# Patient Record
Sex: Female | Born: 2000 | Race: Black or African American | Hispanic: No | Marital: Single | Smoking: Never smoker
Health system: Southern US, Community
[De-identification: ages and names within clinical notes are randomized; demographics above are authoritative.]

## PROBLEM LIST (undated history)

## (undated) DIAGNOSIS — J45909 Unspecified asthma, uncomplicated: Secondary | ICD-10-CM

## (undated) HISTORY — PX: PATENT DUCTUS ARTERIOUS REPAIR: SHX269

---
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
## 2021-01-02 ENCOUNTER — Other Ambulatory Visit: Payer: Self-pay

## 2021-01-02 ENCOUNTER — Emergency Department (HOSPITAL_COMMUNITY): Payer: Self-pay

## 2021-01-02 ENCOUNTER — Emergency Department (HOSPITAL_COMMUNITY)
Admission: EM | Admit: 2021-01-02 | Discharge: 2021-01-02 | Disposition: A | Discharge status: Home / Self Care | Payer: Self-pay | Attending: Emergency Medicine | Admitting: Emergency Medicine

## 2021-01-02 DIAGNOSIS — S6991XA Unspecified injury of right wrist, hand and finger(s), initial encounter: Secondary | ICD-10-CM | POA: Insufficient documentation

## 2021-01-02 DIAGNOSIS — W01198A Fall on same level from slipping, tripping and stumbling with subsequent striking against other object, initial encounter: Secondary | ICD-10-CM | POA: Insufficient documentation

## 2021-01-02 DIAGNOSIS — J45909 Unspecified asthma, uncomplicated: Secondary | ICD-10-CM | POA: Insufficient documentation

## 2021-01-02 MED ORDER — KETOROLAC TROMETHAMINE 60 MG/2ML IM SOLN
60.0000 mg | Freq: Once | INTRAMUSCULAR | Status: AC
  Administered 2021-01-02: 60 mg via INTRAMUSCULAR
  Filled 2021-01-02: qty 2

## 2021-01-02 NOTE — Discharge Instructions (Signed)
Plain films obtained of your right wrist are negative for fracture.  However, your history and physical exam is concerning for a scaphoid bone injury.  Please read the attachment on scaphoid fractures.  I would like for you to follow-up with Dr. Amanda Pea, hand surgery, in the next few days.  Please continue with 600 mg ibuprofen every 6 hours as needed for pain and inflammation.  You may use Tylenol as needed for additional pain relief.  I encourage you to ice your right wrist and keep it elevated whenever possible.  Continue to wear your thumb spica splint.    Return to the ED or seek immediate medical attention should you experience any new worsening symptoms.

## 2021-01-02 NOTE — ED Notes (Signed)
Ortho tech called for placement of thumb spica.
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
## 2021-01-02 NOTE — Progress Notes (Signed)
Orthopedic Tech Progress Note Patient Details:  Sheila Kelly 24-Oct-2001 [id number]  Ortho Devices Type of Ortho Device: Thumb spica splint Ortho Device/Splint Location: right   Post Interventions Patient Tolerated: Well   Saul Fordyce 01/02/2021, 7:23 AM
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
## 2021-04-13 ENCOUNTER — Emergency Department (HOSPITAL_BASED_OUTPATIENT_CLINIC_OR_DEPARTMENT_OTHER)
Admission: EM | Admit: 2021-04-13 | Discharge: 2021-04-13 | Disposition: A | Discharge status: Home / Self Care | Payer: Self-pay | Attending: Emergency Medicine | Admitting: Emergency Medicine

## 2021-04-13 ENCOUNTER — Other Ambulatory Visit: Payer: Self-pay

## 2021-04-13 ENCOUNTER — Encounter (HOSPITAL_BASED_OUTPATIENT_CLINIC_OR_DEPARTMENT_OTHER): Payer: Self-pay | Admitting: Emergency Medicine

## 2021-04-13 DIAGNOSIS — R110 Nausea: Secondary | ICD-10-CM | POA: Insufficient documentation

## 2021-04-13 DIAGNOSIS — R197 Diarrhea, unspecified: Secondary | ICD-10-CM | POA: Insufficient documentation

## 2021-04-13 DIAGNOSIS — R630 Anorexia: Secondary | ICD-10-CM | POA: Insufficient documentation

## 2021-04-13 DIAGNOSIS — J45909 Unspecified asthma, uncomplicated: Secondary | ICD-10-CM | POA: Insufficient documentation

## 2021-04-13 DIAGNOSIS — R1013 Epigastric pain: Secondary | ICD-10-CM | POA: Insufficient documentation

## 2021-04-13 DIAGNOSIS — R109 Unspecified abdominal pain: Secondary | ICD-10-CM

## 2021-04-13 LAB — URINALYSIS, ROUTINE W REFLEX MICROSCOPIC
Bilirubin Urine: NEGATIVE
Glucose, UA: NEGATIVE mg/dL
Hgb urine dipstick: NEGATIVE
Ketones, ur: NEGATIVE mg/dL
Leukocytes,Ua: NEGATIVE
Nitrite: NEGATIVE
Protein, ur: NEGATIVE mg/dL
Specific Gravity, Urine: 1.02 (ref 1.005–1.030)
pH: 6.5 (ref 5.0–8.0)

## 2021-04-13 LAB — CBC WITH DIFFERENTIAL/PLATELET
Abs Immature Granulocytes: 0.01 10*3/uL (ref 0.00–0.07)
Basophils Absolute: 0 10*3/uL (ref 0.0–0.1)
Basophils Relative: 0 %
Eosinophils Absolute: 0.1 10*3/uL (ref 0.0–0.5)
Eosinophils Relative: 1 %
HCT: 39.5 % (ref 36.0–46.0)
Hemoglobin: 12.5 g/dL (ref 12.0–15.0)
Immature Granulocytes: 0 %
Lymphocytes Relative: 26 %
Lymphs Abs: 1.6 10*3/uL (ref 0.7–4.0)
MCH: 24.2 pg — ABNORMAL LOW (ref 26.0–34.0)
MCHC: 31.6 g/dL (ref 30.0–36.0)
MCV: 76.4 fL — ABNORMAL LOW (ref 80.0–100.0)
Monocytes Absolute: 0.4 10*3/uL (ref 0.1–1.0)
Monocytes Relative: 6 %
Neutro Abs: 4.2 10*3/uL (ref 1.7–7.7)
Neutrophils Relative %: 67 %
Platelets: 235 10*3/uL (ref 150–400)
RBC: 5.17 MIL/uL — ABNORMAL HIGH (ref 3.87–5.11)
RDW: 14.6 % (ref 11.5–15.5)
WBC: 6.2 10*3/uL (ref 4.0–10.5)
nRBC: 0 % (ref 0.0–0.2)

## 2021-04-13 LAB — COMPREHENSIVE METABOLIC PANEL
ALT: 11 U/L (ref 0–44)
AST: 20 U/L (ref 15–41)
Albumin: 4.4 g/dL (ref 3.5–5.0)
Alkaline Phosphatase: 43 U/L (ref 38–126)
Anion gap: 6 (ref 5–15)
BUN: 9 mg/dL (ref 6–20)
CO2: 25 mmol/L (ref 22–32)
Calcium: 9 mg/dL (ref 8.9–10.3)
Chloride: 106 mmol/L (ref 98–111)
Creatinine, Ser: 0.97 mg/dL (ref 0.44–1.00)
GFR, Estimated: >60 mL/min (ref >60)
Glucose, Bld: 83 mg/dL (ref 70–99)
Potassium: 3.9 mmol/L (ref 3.5–5.1)
Sodium: 137 mmol/L (ref 135–145)
Total Bilirubin: 0.4 mg/dL (ref 0.3–1.2)
Total Protein: 7.1 g/dL (ref 6.5–8.1)

## 2021-04-13 LAB — LIPASE, BLOOD: Lipase: 37 U/L (ref 11–51)

## 2021-04-13 LAB — WET PREP, GENITAL
Clue Cells Wet Prep HPF POC: NONE SEEN
Sperm: NONE SEEN
Trich, Wet Prep: NONE SEEN
Yeast Wet Prep HPF POC: NONE SEEN

## 2021-04-13 LAB — PREGNANCY, URINE: Preg Test, Ur: NEGATIVE

## 2021-04-13 MED ORDER — PANTOPRAZOLE SODIUM 40 MG IV SOLR
40.0000 mg | Freq: Once | INTRAVENOUS | Status: AC
  Administered 2021-04-13: 40 mg via INTRAVENOUS
  Filled 2021-04-13: qty 40

## 2021-04-13 MED ORDER — ONDANSETRON HCL 4 MG PO TABS: 4.0000 mg | ORAL_TABLET | Freq: Three times a day (TID) | ORAL | 0 refills | Status: DC | PRN

## 2021-04-13 MED ORDER — SODIUM CHLORIDE 0.9 % IV SOLN
INTRAVENOUS | Status: DC | PRN
  Administered 2021-04-13: 500 mL via INTRAVENOUS

## 2021-04-13 MED ORDER — FAMOTIDINE IN NACL 20-0.9 MG/50ML-% IV SOLN
20.0000 mg | Freq: Once | INTRAVENOUS | Status: AC
  Administered 2021-04-13: 20 mg via INTRAVENOUS
  Filled 2021-04-13: qty 50

## 2021-04-13 MED ORDER — ONDANSETRON HCL 4 MG/2ML IJ SOLN
4.0000 mg | Freq: Once | INTRAMUSCULAR | Status: AC
  Administered 2021-04-13: 4 mg via INTRAVENOUS
  Filled 2021-04-13: qty 2

## 2021-04-13 NOTE — ED Notes (Signed)
Pelvic cart at room 

## 2021-04-13 NOTE — ED Notes (Signed)
Ptt given fluids and/or food for PO challenge. Pt verbalized understanding to utilize call bell if nausea or emesis occur.

## 2021-04-13 NOTE — ED Provider Notes (Signed)
[facility] EMERGENCY DEPARTMENT Provider Note   CSN: [id number] Arrival date & time: 04/13/21  0748     History Chief Complaint  Patient presents with   Abdominal Pain    Sheila Kelly is a 20 y.o. female.  HPI  20 year old female presents the emergency department epigastric discomfort and diarrhea that has been going on intermittently for the past month.  She has been having severe nausea and decreased appetite.  Sometimes eating makes this discomfort better.  Patient also has concern for intermittent vaginal bleeding for the past 5 months and a foul odor.  Patient states she previously had the Implanon which was causing breakthrough bleeding.  She changed to the Depo shot which had her well controlled but she recently stopped taking the Depo shot.  [facility] does not have a consistent OB/GYN.  She denies any abnormal vaginal discharge but endorses a "fishy" like odor.  She is sexually active.  No fever or pelvic pain.  Past Medical History:  Diagnosis Date   Asthma    Prematurity     There are no problems to display for this patient.   Past Surgical History:  Procedure Laterality Date   PATENT DUCTUS ARTERIOUS REPAIR       OB History   No obstetric history on file.     History reviewed. No pertinent family history.  Social History   Tobacco Use   Smoking status: Never   Smokeless tobacco: Never  Vaping Use   Vaping Use: Every day  Substance Use Topics   Alcohol use: Yes    Comment: once a week   Drug use: Yes    Types: Marijuana    Comment: daily    Home Medications Prior to Admission medications   Medication Sig Start Date End Date Taking? Authorizing Provider  ondansetron (ZOFRAN ODT) 4 MG disintegrating tablet Take 1 tablet (4 mg total) by mouth every 8 (eight) hours as needed for nausea or vomiting. 06/20/18   Antony Madura, PA-C    Allergies    Patient has no known allergies.  Review of Systems   Review of  Systems  Constitutional:  Negative for chills and fever.  Respiratory:  Negative for shortness of breath.   Cardiovascular:  Negative for chest pain.  Gastrointestinal:  Negative for abdominal pain, diarrhea and vomiting.  Genitourinary:  Positive for dyspareunia, menstrual problem and vaginal bleeding. Negative for dysuria, pelvic pain, vaginal discharge and vaginal pain.  Skin:  Negative for rash.  Neurological:  Negative for headaches.   Physical Exam Updated Vital Signs BP 121/74   Pulse (!) 53   Temp 98.7 F (37.1 C) (Oral)   Resp 18   Ht 5\' 8"  (1.727 m)   Wt 54.4 kg   LMP 02/17/2021 Comment: Vaginal bleeding has continued since 4/17  SpO2 96%   BMI 18.25 kg/m   Physical Exam Vitals and nursing note reviewed.  Constitutional:      Appearance: Normal appearance.  HENT:     Head: Normocephalic.     Mouth/Throat:     Mouth: Mucous membranes are moist.  Cardiovascular:     Rate and Rhythm: Normal rate.  Pulmonary:     Effort: Pulmonary effort is normal. No respiratory distress.  Abdominal:     Palpations: Abdomen is soft.     Tenderness: There is no abdominal tenderness.  Genitourinary:    Vagina: No signs of injury. No tenderness or bleeding.     Comments: Chaperoned by  RN, small amount of what appears to be physiologic discharge, slight odor, no bleeding or structural abnormality Skin:    General: Skin is warm.  Neurological:     Mental Status: She is alert and oriented to person, place, and time. Mental status is at baseline.  Psychiatric:        Mood and Affect: Mood normal.    ED Results / Procedures / Treatments   Labs (all labs ordered are listed, but only abnormal results are displayed) Labs Reviewed  CBC WITH DIFFERENTIAL/PLATELET - Abnormal; Notable for the following components:      Result Value   RBC 5.17 (*)    MCV 76.4 (*)    MCH 24.2 (*)    All other components within normal limits  WET PREP, GENITAL  COMPREHENSIVE METABOLIC PANEL  LIPASE,  BLOOD  URINALYSIS, ROUTINE W REFLEX MICROSCOPIC  PREGNANCY, URINE  GC/CHLAMYDIA PROBE AMP ([place]) NOT AT [facility]    EKG None  Radiology No results found.  Procedures Procedures   Medications Ordered in ED Medications  0.9 %  sodium chloride infusion ( Intravenous Infusion Verify 04/13/21 1020)  pantoprazole (PROTONIX) injection 40 mg (has no administration in time range)  famotidine (PEPCID) IVPB 20 mg premix (0 mg Intravenous Stopped 04/13/21 1016)  ondansetron (ZOFRAN) injection 4 mg (4 mg Intravenous Given 04/13/21 7616)    ED Course  I have reviewed the triage vital signs and the nursing notes.  Pertinent labs & imaging results that were available during my care of the patient were reviewed by me and considered in my medical decision making (see chart for details).    MDM Rules/Calculators/A&P                          20 year old female presents the emergency department epigastric discomfort and diarrhea has been intermittent for the past month.  She also states that she has had intermittent breakthrough bleeding for the past 5 months after stopping her Depo shot.  Does not currently have an OB/GYN.  Denies any other abnormal vaginal discharge but endorses a fishy odor from the vagina.  No fever.  Abdomen is benign.  Pregnancy test is negative, hemoglobin is stable, abdominal labs are normal, blood work is reassuring, after medications she feels significant better is tolerating p.o.  Pelvic which was chaperoned by RN showed no abnormal discharge or odor, no bleeding.  Wet prep shows white blood cells without any other findings.  GC chlamydia swab sent.  Instructed patient to follow-up with primary doctor and establish OB/GYN care.  Patient will be discharged and treated as an outpatient.  Discharge plan and strict return to ED precautions discussed, patient verbalizes understanding and agreement.   Final Clinical Impression(s) / ED Diagnoses Final diagnoses:  None     Rx / DC Orders ED Discharge Orders     None        Rozelle Logan, DO 04/13/21 1227

## 2021-04-13 NOTE — ED Notes (Signed)
ED Provider at bedside. 

## 2021-04-13 NOTE — Discharge Instructions (Addendum)
You have been seen and discharged from the emergency department.  Take nausea medicine as needed, stay well-hydrated.  Your cervical swab is still pending, you will be contacted if there is an abnormality found.  You need to establish care with an OB/GYN to be reevaluated for intermittent vaginal bleeding and birth control options.  Follow-up with your primary provider for reevaluation and further care. Take home medications as prescribed. If you have any worsening symptoms or further concerns for your health please return to an emergency department for further evaluation.

## 2021-04-13 NOTE — ED Triage Notes (Signed)
Pt arrives pov with c/o upper abdominal pain and diarrhea x 1 month. Pt endorses nausea, denies emesis. Pt reports eating makes pain better. Pt also reports vaginal bleeding x 5 months

## 2021-04-13 NOTE — ED Notes (Signed)
Pt ambulatory with steady gait to restroom 

## 2021-04-13 NOTE — ED Notes (Signed)
Pt ambulatory with steady gait to restroom to provide urine specimen 

## 2021-04-13 NOTE — ED Notes (Addendum)
Chaperone for EDP Horton, MD for pelvic exam. Specimens collected, Pt tolerated well.

## 2021-04-15 LAB — GC/CHLAMYDIA PROBE AMP (~~LOC~~) NOT AT ARMC
Chlamydia: NEGATIVE
Comment: NEGATIVE
Comment: NORMAL
Neisseria Gonorrhea: NEGATIVE

## 2022-02-04 ENCOUNTER — Encounter (HOSPITAL_COMMUNITY): Payer: Self-pay | Admitting: *Deleted

## 2022-02-04 ENCOUNTER — Inpatient Hospital Stay (HOSPITAL_COMMUNITY)
Admission: AD | Admit: 2022-02-04 | Discharge: 2022-02-04 | Discharge status: Against Medical Advice | Payer: BC Managed Care – PPO | Attending: Family Medicine | Admitting: Family Medicine

## 2022-02-04 DIAGNOSIS — N939 Abnormal uterine and vaginal bleeding, unspecified: Secondary | ICD-10-CM | POA: Diagnosis not present

## 2022-02-04 DIAGNOSIS — Z3201 Encounter for pregnancy test, result positive: Secondary | ICD-10-CM | POA: Insufficient documentation

## 2022-02-04 DIAGNOSIS — R109 Unspecified abdominal pain: Secondary | ICD-10-CM | POA: Insufficient documentation

## 2022-02-04 DIAGNOSIS — O00102 Left tubal pregnancy without intrauterine pregnancy: Secondary | ICD-10-CM | POA: Diagnosis not present

## 2022-02-04 LAB — COMPREHENSIVE METABOLIC PANEL
ALT: 15 U/L (ref 0–44)
AST: 20 U/L (ref 15–41)
Albumin: 4.2 g/dL (ref 3.5–5.0)
Alkaline Phosphatase: 59 U/L (ref 38–126)
Anion gap: 8 (ref 5–15)
BUN: 10 mg/dL (ref 6–20)
CO2: 23 mmol/L (ref 22–32)
Calcium: 9.1 mg/dL (ref 8.9–10.3)
Chloride: 106 mmol/L (ref 98–111)
Creatinine, Ser: 0.89 mg/dL (ref 0.44–1.00)
GFR, Estimated: >60 mL/min (ref >60)
Glucose, Bld: 82 mg/dL (ref 70–99)
Potassium: 3.6 mmol/L (ref 3.5–5.1)
Sodium: 137 mmol/L (ref 135–145)
Total Bilirubin: 0.6 mg/dL (ref 0.3–1.2)
Total Protein: 7.1 g/dL (ref 6.5–8.1)

## 2022-02-04 LAB — WET PREP, GENITAL
Sperm: NONE SEEN
Trich, Wet Prep: NONE SEEN
WBC, Wet Prep HPF POC: <10 (ref <10)
Yeast Wet Prep HPF POC: NONE SEEN

## 2022-02-04 LAB — CBC
HCT: 35.8 % — ABNORMAL LOW (ref 36.0–46.0)
Hemoglobin: 11.3 g/dL — ABNORMAL LOW (ref 12.0–15.0)
MCH: 23.7 pg — ABNORMAL LOW (ref 26.0–34.0)
MCHC: 31.6 g/dL (ref 30.0–36.0)
MCV: 75.2 fL — ABNORMAL LOW (ref 80.0–100.0)
Platelets: 252 10*3/uL (ref 150–400)
RBC: 4.76 MIL/uL (ref 3.87–5.11)
RDW: 14.1 % (ref 11.5–15.5)
WBC: 6 10*3/uL (ref 4.0–10.5)
nRBC: 0 % (ref 0.0–0.2)

## 2022-02-04 LAB — URINALYSIS, ROUTINE W REFLEX MICROSCOPIC
Bilirubin Urine: NEGATIVE
Glucose, UA: NEGATIVE mg/dL
Hgb urine dipstick: NEGATIVE
Ketones, ur: 5 mg/dL — AB
Leukocytes,Ua: NEGATIVE
Nitrite: NEGATIVE
Protein, ur: NEGATIVE mg/dL
Specific Gravity, Urine: 1.021 (ref 1.005–1.030)
pH: 5 (ref 5.0–8.0)

## 2022-02-04 LAB — POCT PREGNANCY, URINE: Preg Test, Ur: POSITIVE — AB

## 2022-02-04 LAB — HCG, QUANTITATIVE, PREGNANCY: hCG, Beta Chain, Quant, S: 7899 m[IU]/mL — ABNORMAL HIGH (ref <5)

## 2022-02-04 LAB — ABO/RH: ABO/RH(D): B POS

## 2022-02-04 NOTE — MAU Provider Note (Signed)
None  ?  ? ?S ?Ms. Zulma Court is a 21 y.o. G1P0 patient who presents to MAU today with complaint of spotting and cramping.   ? ?O ?LMP 11/09/2021  ?Physical Exam ?Constitutional:   ?   General: She is not in acute distress. ?   Appearance: Normal appearance.  ?HENT:  ?   Head: Normocephalic and atraumatic.  ?Eyes:  ?   Conjunctiva/sclera: Conjunctivae normal.  ?Pulmonary:  ?   Effort: Pulmonary effort is normal. No respiratory distress.  ?Musculoskeletal:     ?   General: Normal range of motion.  ?   Cervical back: Normal range of motion.  ?Neurological:  ?   Mental Status: She is oriented to person, place, and time.  ?Psychiatric:     ?   Mood and Affect: Mood normal.     ?   Behavior: Behavior normal.  ? ? ?A ?Medical screening exam complete ?+UPT ? ?P ?Patient [facility] verification from [facility]. ?Initial labs and cultures entered and collected.  ?Patient informed that provider will return for formal HPI and assessment. ? ?Cherre Robins ?02/04/2022, 11:21 AM  ? ?Reassessment (12:36 PM) ? ?Triage nurse to collect patient and reports patient not in lobby. ?Provider will call patient once results return ? ?Cherre Robins MSN, CNM ?Advanced Practice Provider, Center for [employer] ? ?

## 2022-02-04 NOTE — Progress Notes (Signed)
Pt called into triage, pt left AMA during mid exam.  Work up initiated while in lobby waiting. ? ?

## 2022-02-05 ENCOUNTER — Inpatient Hospital Stay (HOSPITAL_COMMUNITY): Payer: BC Managed Care – PPO

## 2022-02-05 ENCOUNTER — Encounter (HOSPITAL_COMMUNITY): Payer: Self-pay | Admitting: Family Medicine

## 2022-02-05 ENCOUNTER — Other Ambulatory Visit: Payer: Self-pay

## 2022-02-05 ENCOUNTER — Inpatient Hospital Stay (HOSPITAL_COMMUNITY)
Admission: AD | Admit: 2022-02-05 | Discharge: 2022-02-05 | Disposition: A | Discharge status: Home / Self Care | Payer: BC Managed Care – PPO | Attending: Family Medicine | Admitting: Family Medicine

## 2022-02-05 ENCOUNTER — Inpatient Hospital Stay (HOSPITAL_COMMUNITY): Payer: BC Managed Care – PPO | Admitting: Certified Registered Nurse Anesthetist

## 2022-02-05 ENCOUNTER — Encounter (HOSPITAL_COMMUNITY)
Admission: AD | Disposition: A | Discharge status: Home / Self Care | Payer: Self-pay | Source: Home / Self Care | Attending: Family Medicine

## 2022-02-05 DIAGNOSIS — O00109 Unspecified tubal pregnancy without intrauterine pregnancy: Secondary | ICD-10-CM | POA: Diagnosis present

## 2022-02-05 DIAGNOSIS — N736 Female pelvic peritoneal adhesions (postinfective): Secondary | ICD-10-CM | POA: Diagnosis not present

## 2022-02-05 DIAGNOSIS — O00102 Left tubal pregnancy without intrauterine pregnancy: Secondary | ICD-10-CM

## 2022-02-05 DIAGNOSIS — Z3A01 Less than 8 weeks gestation of pregnancy: Secondary | ICD-10-CM | POA: Insufficient documentation

## 2022-02-05 DIAGNOSIS — N83201 Unspecified ovarian cyst, right side: Secondary | ICD-10-CM | POA: Diagnosis not present

## 2022-02-05 DIAGNOSIS — Z6790 Unspecified blood type, Rh positive: Secondary | ICD-10-CM

## 2022-02-05 HISTORY — PX: DIAGNOSTIC LAPAROSCOPY WITH REMOVAL OF ECTOPIC PREGNANCY: SHX6449

## 2022-02-05 LAB — GC/CHLAMYDIA PROBE AMP (~~LOC~~) NOT AT ARMC
Chlamydia: NEGATIVE
Comment: NEGATIVE
Comment: NORMAL
Neisseria Gonorrhea: NEGATIVE

## 2022-02-05 LAB — TYPE AND SCREEN
ABO/RH(D): B POS
Antibody Screen: NEGATIVE

## 2022-02-05 SURGERY — LAPAROSCOPY, WITH ECTOPIC PREGNANCY SURGICAL TREATMENT
Anesthesia: General

## 2022-02-05 MED ORDER — VASOPRESSIN 20 UNIT/ML IV SOLN
INTRAVENOUS | Status: AC
  Filled 2022-02-05: qty 1

## 2022-02-05 MED ORDER — PROPOFOL 10 MG/ML IV BOLUS
INTRAVENOUS | Status: AC
  Filled 2022-02-05: qty 20

## 2022-02-05 MED ORDER — CELECOXIB 200 MG PO CAPS: 200.0000 mg | ORAL_CAPSULE | Freq: Once | ORAL | Status: DC

## 2022-02-05 MED ORDER — SUCCINYLCHOLINE CHLORIDE 200 MG/10ML IV SOSY
PREFILLED_SYRINGE | INTRAVENOUS | Status: AC
  Filled 2022-02-05: qty 10

## 2022-02-05 MED ORDER — CHLORHEXIDINE GLUCONATE 0.12 % MT SOLN
15.0000 mL | Freq: Once | OROMUCOSAL | Status: DC
  Filled 2022-02-05: qty 15

## 2022-02-05 MED ORDER — FENTANYL CITRATE (PF) 250 MCG/5ML IJ SOLN
INTRAMUSCULAR | Status: AC
  Filled 2022-02-05: qty 5

## 2022-02-05 MED ORDER — OXYCODONE HCL 5 MG/5ML PO SOLN: 5.0000 mg | Freq: Once | ORAL | Status: DC | PRN

## 2022-02-05 MED ORDER — SODIUM CHLORIDE 0.9 % IR SOLN
Status: DC | PRN
  Administered 2022-02-05: 3000 mL

## 2022-02-05 MED ORDER — MIDAZOLAM HCL 2 MG/2ML IJ SOLN
INTRAMUSCULAR | Status: AC
  Filled 2022-02-05: qty 2

## 2022-02-05 MED ORDER — ACETAMINOPHEN 325 MG PO TABS: 650.0000 mg | ORAL_TABLET | Freq: Four times a day (QID) | ORAL | Status: AC | PRN

## 2022-02-05 MED ORDER — IBUPROFEN 600 MG PO TABS: 600.0000 mg | ORAL_TABLET | Freq: Four times a day (QID) | ORAL | 0 refills | Status: AC | PRN

## 2022-02-05 MED ORDER — FENTANYL CITRATE (PF) 100 MCG/2ML IJ SOLN
INTRAMUSCULAR | Status: AC
  Filled 2022-02-05: qty 2

## 2022-02-05 MED ORDER — ONDANSETRON 4 MG PO TBDP
4.0000 mg | ORAL_TABLET | Freq: Three times a day (TID) | ORAL | 0 refills | Status: DC | PRN
Start: 2022-02-05 — End: 2023-03-13

## 2022-02-05 MED ORDER — SUGAMMADEX SODIUM 200 MG/2ML IV SOLN
INTRAVENOUS | Status: DC | PRN
  Administered 2022-02-05: 150 mg via INTRAVENOUS

## 2022-02-05 MED ORDER — LIDOCAINE 2% (20 MG/ML) 5 ML SYRINGE
INTRAMUSCULAR | Status: DC | PRN
  Administered 2022-02-05: 50 mg via INTRAVENOUS

## 2022-02-05 MED ORDER — BUPIVACAINE HCL (PF) 0.25 % IJ SOLN
INTRAMUSCULAR | Status: AC
  Filled 2022-02-05: qty 30

## 2022-02-05 MED ORDER — LIDOCAINE 2% (20 MG/ML) 5 ML SYRINGE
INTRAMUSCULAR | Status: AC
  Filled 2022-02-05: qty 5

## 2022-02-05 MED ORDER — OXYCODONE HCL 5 MG PO TABS: 5.0000 mg | ORAL_TABLET | Freq: Four times a day (QID) | ORAL | 0 refills | Status: AC | PRN

## 2022-02-05 MED ORDER — ONDANSETRON HCL 4 MG/2ML IJ SOLN: 4.0000 mg | Freq: Once | INTRAMUSCULAR | Status: DC | PRN

## 2022-02-05 MED ORDER — SUCCINYLCHOLINE CHLORIDE 200 MG/10ML IV SOSY
PREFILLED_SYRINGE | INTRAVENOUS | Status: DC | PRN
  Administered 2022-02-05: 120 mg via INTRAVENOUS

## 2022-02-05 MED ORDER — ORAL CARE MOUTH RINSE: 15.0000 mL | Freq: Once | OROMUCOSAL | Status: DC

## 2022-02-05 MED ORDER — ROCURONIUM BROMIDE 10 MG/ML (PF) SYRINGE
PREFILLED_SYRINGE | INTRAVENOUS | Status: AC
  Filled 2022-02-05: qty 10

## 2022-02-05 MED ORDER — OXYCODONE HCL 5 MG PO TABS: 5.0000 mg | ORAL_TABLET | Freq: Once | ORAL | Status: DC | PRN

## 2022-02-05 MED ORDER — FENTANYL CITRATE (PF) 100 MCG/2ML IJ SOLN
25.0000 ug | INTRAMUSCULAR | Status: DC | PRN
  Administered 2022-02-05: 50 ug via INTRAVENOUS
  Administered 2022-02-05: 25 ug via INTRAVENOUS

## 2022-02-05 MED ORDER — DEXAMETHASONE SODIUM PHOSPHATE 10 MG/ML IJ SOLN
INTRAMUSCULAR | Status: DC | PRN
  Administered 2022-02-05: 10 mg via INTRAVENOUS

## 2022-02-05 MED ORDER — OXYCODONE HCL 5 MG PO CAPS: 5.0000 mg | ORAL_CAPSULE | Freq: Four times a day (QID) | ORAL | 0 refills | Status: DC | PRN

## 2022-02-05 MED ORDER — MIDAZOLAM HCL 2 MG/2ML IJ SOLN
INTRAMUSCULAR | Status: DC | PRN
Start: 2022-02-05 — End: 2022-02-05
  Administered 2022-02-05: 2 mg via INTRAVENOUS

## 2022-02-05 MED ORDER — LACTATED RINGERS IV SOLN: INTRAVENOUS | Status: DC

## 2022-02-05 MED ORDER — ROCURONIUM BROMIDE 10 MG/ML (PF) SYRINGE
PREFILLED_SYRINGE | INTRAVENOUS | Status: DC | PRN
  Administered 2022-02-05: 40 mg via INTRAVENOUS

## 2022-02-05 MED ORDER — ONDANSETRON HCL 4 MG/2ML IJ SOLN
INTRAMUSCULAR | Status: AC
  Filled 2022-02-05: qty 2

## 2022-02-05 MED ORDER — FENTANYL CITRATE (PF) 250 MCG/5ML IJ SOLN
INTRAMUSCULAR | Status: DC | PRN
  Administered 2022-02-05: 50 ug via INTRAVENOUS
  Administered 2022-02-05: 100 ug via INTRAVENOUS
  Administered 2022-02-05: 25 ug via INTRAVENOUS

## 2022-02-05 MED ORDER — PROPOFOL 10 MG/ML IV BOLUS
INTRAVENOUS | Status: DC | PRN
  Administered 2022-02-05: 200 mg via INTRAVENOUS

## 2022-02-05 MED ORDER — LACTATED RINGERS IV BOLUS
1000.0000 mL | Freq: Once | INTRAVENOUS | Status: AC
  Administered 2022-02-05: 1000 mL via INTRAVENOUS

## 2022-02-05 MED ORDER — DOCUSATE SODIUM 100 MG PO CAPS: 100.0000 mg | ORAL_CAPSULE | Freq: Two times a day (BID) | ORAL | 0 refills | Status: AC | PRN

## 2022-02-05 MED ORDER — ONDANSETRON HCL 4 MG/2ML IJ SOLN
INTRAMUSCULAR | Status: DC | PRN
  Administered 2022-02-05: 4 mg via INTRAVENOUS

## 2022-02-05 MED ORDER — BUPIVACAINE HCL (PF) 0.25 % IJ SOLN
INTRAMUSCULAR | Status: DC | PRN
  Administered 2022-02-05: 6 mL

## 2022-02-05 MED ORDER — ACETAMINOPHEN 500 MG PO TABS: 1000.0000 mg | ORAL_TABLET | Freq: Once | ORAL | Status: DC

## 2022-02-05 MED ORDER — DEXAMETHASONE SODIUM PHOSPHATE 10 MG/ML IJ SOLN
INTRAMUSCULAR | Status: AC
  Filled 2022-02-05: qty 1

## 2022-02-05 SURGICAL SUPPLY — 30 items
DERMABOND ADVANCED (GAUZE/BANDAGES/DRESSINGS) ×1
DERMABOND ADVANCED .7 DNX12 (GAUZE/BANDAGES/DRESSINGS) ×1 IMPLANT
DRSG OPSITE POSTOP 3X4 (GAUZE/BANDAGES/DRESSINGS) ×1 IMPLANT
DURAPREP 26ML APPLICATOR (WOUND CARE) ×2 IMPLANT
GLOVE SURG ENC MOIS LTX SZ7 (GLOVE) ×2 IMPLANT
GLOVE SURG UNDER POLY LF SZ7 (GLOVE) ×8 IMPLANT
GOWN STRL REUS W/ TWL LRG LVL3 (GOWN DISPOSABLE) ×3 IMPLANT
GOWN STRL REUS W/TWL LRG LVL3 (GOWN DISPOSABLE) ×3
IRRIG SUCT STRYKERFLOW 2 WTIP (MISCELLANEOUS) ×2
IRRIGATION SUCT STRKRFLW 2 WTP (MISCELLANEOUS) ×1 IMPLANT
KIT TURNOVER KIT B (KITS) ×2 IMPLANT
PACK LAPAROSCOPY BASIN (CUSTOM PROCEDURE TRAY) ×2 IMPLANT
PACK TRENDGUARD 450 HYBRID PRO (MISCELLANEOUS) IMPLANT
POUCH SPECIMEN RETRIEVAL 10MM (ENDOMECHANICALS) ×1 IMPLANT
PROTECTOR NERVE ULNAR (MISCELLANEOUS) ×4 IMPLANT
SET TUBE SMOKE EVAC HIGH FLOW (TUBING) ×2 IMPLANT
SHEARS HARMONIC ACE PLUS 36CM (ENDOMECHANICALS) ×1 IMPLANT
SLEEVE ENDOPATH XCEL 5M (ENDOMECHANICALS) ×2 IMPLANT
SPONGE T-LAP 18X18 ~~LOC~~+RFID (SPONGE) ×2 IMPLANT
SUT VIC AB 3-0 PS2 18 (SUTURE) ×1
SUT VIC AB 3-0 PS2 18XBRD (SUTURE) ×1 IMPLANT
SUT VICRYL 0 UR6 27IN ABS (SUTURE) ×4 IMPLANT
SUT VICRYL 4-0 PS2 18IN ABS (SUTURE) ×2 IMPLANT
TOWEL GREEN STERILE FF (TOWEL DISPOSABLE) ×4 IMPLANT
TRAY FOLEY W/BAG SLVR 14FR (SET/KITS/TRAYS/PACK) ×2 IMPLANT
TRENDGUARD 450 HYBRID PRO PACK (MISCELLANEOUS) ×2
TROCAR BALLN 12MMX100 BLUNT (TROCAR) ×2 IMPLANT
TROCAR XCEL NON-BLD 11X100MML (ENDOMECHANICALS) ×1 IMPLANT
TROCAR XCEL NON-BLD 5MMX100MML (ENDOMECHANICALS) ×2 IMPLANT
WARMER LAPAROSCOPE (MISCELLANEOUS) ×2 IMPLANT

## 2022-02-05 NOTE — H&P (Signed)
?  Sheila Kelly is an 21 y.o. G1P0 female.   ?Chief Complaint: spotting ?HPI: patient with bleeding and LLQ cramping, comes to MAU for eval of pregnancy. U/S reveals living left sided ectopic. ? ?Past Medical History:  ?Diagnosis Date  ? Asthma   ? Prematurity   ? ? ?Past Surgical History:  ?Procedure Laterality Date  ? PATENT DUCTUS ARTERIOUS REPAIR    ? ? ?No family history on file. ?Social History:  reports that she has never smoked. She has never used smokeless tobacco. She reports current alcohol use. She reports current drug use. Drug: Marijuana. ? ?Allergies: No Known Allergies ? ?Medications Prior to Admission  ?Medication Sig Dispense Refill  ? ondansetron (ZOFRAN) 4 MG tablet Take 1 tablet (4 mg total) by mouth every 8 (eight) hours as needed for nausea or vomiting. 4 tablet 0  ? ? ?A comprehensive review of systems was negative. ? ?Blood pressure 116/71, pulse 63, temperature 99.1 ?F (37.3 ?C), temperature source Oral, resp. rate 16, height 5\' 8"  (1.727 m), weight 54.2 kg, last menstrual period 11/09/2021, SpO2 100 %. ?General appearance: alert, cooperative, and appears stated age ?Head: Normocephalic, without obvious abnormality, atraumatic ?Neck: no adenopathy, supple, symmetrical, trachea midline, and thyroid not enlarged, symmetric, no tenderness/mass/nodules ?Lungs: normal effort ?Heart: Regular rate and rhythm ?Abdomen: soft, non-tender; bowel sounds normal; no masses,  no organomegaly ?Extremities: extremities normal, atraumatic, no cyanosis or edema ?Skin: Skin color, texture, turgor normal. No rashes or lesions ?Neurologic: Grossly normal ? ? ?Lab Results  ?Component Value Date  ? WBC 6.0 02/04/2022  ? HGB 11.3 (L) 02/04/2022  ? HCT 35.8 (L) 02/04/2022  ? MCV 75.2 (L) 02/04/2022  ? PLT 252 02/04/2022  ? ?Lab Results  ?Component Value Date  ? PREGTESTUR POSITIVE (A) 02/04/2022  ? HCG <5.0 06/19/2018  ? ? ? ?Assessment/Plan ?Left tubal pregnancy without intrauterine pregnancy ? ?[redacted] weeks gestation of  pregnancy ? ?Blood type, Rh positive ? ?For Laparoscopic removal. ? ?Risks include but are not limited to bleeding, infection, injury to surrounding structures, including bowel, bladder and ureters, blood clots, and death.  Likelihood of success is high. ? ? ? ?06/21/2018 ?02/05/2022, 11:42 AM ? ? ? ?

## 2022-02-05 NOTE — MAU Note (Signed)
Sheila Kelly is a 21 y.o. at [redacted]w[redacted]d here in MAU reporting: ongoing spotting and intermittent cramping. Is wearing a panty liner and changes it 2 times per day. ? ?Onset of complaint: ongoing ? ?Pain score: 0/10 ? ?Vitals:  ? 02/05/22 1032  ?BP: 116/71  ?Pulse: 63  ?Resp: 16  ?Temp: 99.1 ?F (37.3 ?C)  ?SpO2: 100%  ?   ?Lab orders placed from triage: none ? ?

## 2022-02-05 NOTE — MAU Note (Signed)
Report given to Leanne RN

## 2022-02-05 NOTE — Op Note (Signed)
Operative Note  ? ?02/05/2022 ? ?PRE-OP DIAGNOSIS ?*Left tubal ectopic pregnancy ?  ?POST-OP DIAGNOSIS ?*Same ?*Hemoperitoneum ?*Pelvic adhesive disease ?*Simple right ovarian cyst  ? ?SURGEON: Cornelia Copa. MD ? ?ASSISTANT: none ? ?PROCEDURE: Laparoscopic left salpingectomy, lysis of adhesions less than 45 minutes ? ?ANESTHESIA: General and local ? ?ESTIMATED BLOOD LOSS:  minimal for case . of hemoperitoneum ? ?DRAINS: indwelling foley 69mL UOP  ? ?TOTAL IV FLUIDS: per OR report ? ?VTE PROPHYLAXIS: SCDs to the bilateral lower extremities ? ?ANTIBIOTICS: Note indicated ? ?SPECIMENS: left fallopian tube ? ?DISPOSITION: PACU - hemodynamically stable. ? ?CONDITION: stable ? ?COMPLICATIONS: None ? ?FINDINGS: Normal EGBUS, vagina and cervix. On laparoscopy, normal liver and stomach edge and no intra-abdominal adhesions. In the pelvis, grossly normal uterus with bilateral adnexa adhesions. On the left side, the tube was engorged at the ampulla region and with evidence of bleeding from the tube of the tube. The left tube and ovary were encased together with adhesions. The ureter on this side was identified and well away from the operative field. The right tube and ovary were also encased together with the right tube grossly normal and the right ovary with a 2cm simple appearing cyst. Approximately of hemoperitoneum was noted in the cul de sac.  ? ?At the end of the procedure the left ovary, was still stuck to the left pelvic sidewall.  ? ?DESCRIPTION OF PROCEDURE: After informed consent was obtained, the patient was taken to the operating room where anesthesia was obtained without difficulty. The patient was positioned in the dorsal lithotomy position in [place] stirrups and her arms were carefully tucked at her sides and the usual precautions were taken.  She was prepped and draped in normal sterile fashion.  Time-out was performed and a Foley catheter was placed into the bladder. A standard H[place]ka uterine  manipulator was then placed in the uterus without incident. Gloves were then changed, and after injection of local anesthesia, the open technique was used to place an infraumbilical 12-mm balloon trocar under direct visualization. The laparoscope was introduced and CO2 gas was infused for pneumoperitoneum to a pressure of 15 mm Hg and the area below inspected for injury.  The patient was placed in Trendelenburg and the bowel was displaced up into the upper abdomen, and the left lateral and suprapubic 5-mm ports were placed under direct visualization of the laparoscope, after injection of local anesthesia.   ? The left ureter was then identified and the tube and ovary were dissected from each. Starting from the cornua, the Harmonic Scalpel was used to serially cauterize and cut the mesosalpinx and more dissection and lysis of adhesions done to help separate the fallopian tube from the ovary until the ovary and tube were freed and tube free. It was then removed with the laparoscopic bag. The operative site was hemostatic but cauterized with the kleppinger for extra hemostasis at the cornua and the meso-salpinx; the left ureter was again identified and traced into the lower pelvis and stil noted to be well away from the operative field. The pressure was lowered to with hemostasis still noted. The 50mm ports were then removed under direct visualization and the the balloon trocar removed. The fascia there was closed with a figure of eight 0 vicryl and the skin closed with 4-0 vicryl. The 87mm ports were closed with Dermabond.  ? The patient tolerated the procedure well.  Sponge, lap and needle counts were correct x2.  The patient was taken to recovery  room in excellent condition. ? ?Cornelia Copa MD ?Attending ?[facility] [employer] [facility]) ? ? ? ?

## 2022-02-05 NOTE — Progress Notes (Signed)
GYN Note ? ?I introduced myself to patient and family that I will be assisting Dr. Nelda Marseille on her case. ? ?Durene Romans MD ?Attending ?[facility] [employer] [facility]) ?02/05/2022 ?Time: 2010 ? ?

## 2022-02-05 NOTE — MAU Provider Note (Signed)
Chief Complaint: Vaginal Bleeding ? ? Event Date/Time  ? First Provider Initiated Contact with Patient 02/05/22 1039   ?  ? ?SUBJECTIVE ?HPI: Sheila Kelly is a 21 y.o. G1P0 at [redacted]w[redacted]d by LMP who presents to maternity admissions reporting spotting and cramping x 1-2 weeks. She presented initially yesterday, on 02/04/22 and had labwork and vaginal swabs but had to leave before her evaluation was completed. She reports spotting continues today, requiring a pantyliner but not a pad, and she denies pain today.  There are no other symptoms.   ? ? ?Past Medical History:  ?Diagnosis Date  ? Asthma   ? Prematurity   ? ?Past Surgical History:  ?Procedure Laterality Date  ? PATENT DUCTUS ARTERIOUS REPAIR    ? ?Social History  ? ?Socioeconomic History  ? Marital status: Single  ?  Spouse name: Not on file  ? Number of children: Not on file  ? Years of education: Not on file  ? Highest education level: Not on file  ?Occupational History  ? Not on file  ?Tobacco Use  ? Smoking status: Never  ? Smokeless tobacco: Never  ?Vaping Use  ? Vaping Use: Every day  ?Substance and Sexual Activity  ? Alcohol use: Yes  ?  Comment: once a week  ? Drug use: Yes  ?  Types: Marijuana  ?  Comment: daily  ? Sexual activity: Not Currently  ?Other Topics Concern  ? Not on file  ?Social History Narrative  ? Not on file  ? ?Social Determinants of Health  ? ?Financial Resource Strain: Not on file  ?Food Insecurity: Not on file  ?Transportation Needs: Not on file  ?Physical Activity: Not on file  ?Stress: Not on file  ?Social Connections: Not on file  ?Intimate Partner Violence: Not on file  ? ?No current facility-administered medications on file prior to encounter.  ? ?Current Outpatient Medications on File Prior to Encounter  ?Medication Sig Dispense Refill  ? ondansetron (ZOFRAN) 4 MG tablet Take 1 tablet (4 mg total) by mouth every 8 (eight) hours as needed for nausea or vomiting. 4 tablet 0  ? ?No Known Allergies ? ?ROS:  ?Review of Systems   ?Constitutional:  Negative for chills, fatigue and fever.  ?Respiratory:  Negative for shortness of breath.   ?Cardiovascular:  Negative for chest pain.  ?Gastrointestinal:  Positive for abdominal pain.  ?Genitourinary:  Positive for vaginal bleeding. Negative for difficulty urinating, dysuria, flank pain, pelvic pain, vaginal discharge and vaginal pain.  ?Neurological:  Negative for dizziness and headaches.  ?Psychiatric/Behavioral: Negative.    ? ? ?I have reviewed patient's Past Medical Hx, Surgical Hx, Family Hx, Social Hx, medications and allergies.  ? ?Physical Exam  ?Patient Vitals for the past 24 hrs: ? BP Temp Temp src Pulse Resp SpO2 Height Weight  ?02/05/22 1032 116/71 99.1 ?F (37.3 ?C) Oral 63 16 100 % -- --  ?02/05/22 1030 -- -- -- -- -- -- 5\' 8"  (1.727 m) 54.2 kg  ? ?Constitutional: Well-developed, well-nourished female in no acute distress.  ?Cardiovascular: normal rate ?Respiratory: normal effort ?GI: Abd soft, non-tender. Pos BS x 4 ?MS: Extremities nontender, no edema, normal ROM ?Neurologic: Alert and oriented x 4.  ?GU: Neg CVAT. ? ?PELVIC EXAM: Deferred ? ?FHT not heard by doppler ? ?LAB RESULTS ?No results found for this or any previous visit (from the past 24 hour(s)). ?Results for orders placed or performed during the hospital encounter of 02/04/22 (from the past 48 hour(s))  ?Wet prep,  genital     Status: Abnormal  ? Collection Time: 02/04/22 11:18 AM  ? Specimen: PATH Cytology Cervicovaginal Ancillary Only  ?Result Value Ref Range  ? Yeast Wet Prep HPF POC NONE SEEN NONE SEEN  ? Trich, Wet Prep NONE SEEN NONE SEEN  ? Clue Cells Wet Prep HPF POC PRESENT (A) NONE SEEN  ? WBC, Wet Prep HPF POC <10 <10  ? Sperm NONE SEEN   ?  Comment: Performed at [facility], [address] [place], [place] [address]  ?Pregnancy, urine POC     Status: Abnormal  ? Collection Time: 02/04/22 11:23 AM  ?Result Value Ref Range  ? Preg Test, Ur POSITIVE (A) NEGATIVE  ?  Comment:        ?THE SENSITIVITY OF  THIS ?METHODOLOGY IS >24 mIU/mL ?  ?CBC     Status: Abnormal  ? Collection Time: 02/04/22 11:29 AM  ?Result Value Ref Range  ? WBC 6.0 4.0 - 10.5 K/uL  ? RBC 4.76 3.87 - 5.11 MIL/uL  ? Hemoglobin 11.3 (L) 12.0 - 15.0 g/dL  ? HCT 35.8 (L) 36.0 - 46.0 %  ? MCV 75.2 (L) 80.0 - 100.0 fL  ? MCH 23.7 (L) 26.0 - 34.0 pg  ? MCHC 31.6 30.0 - 36.0 g/dL  ? RDW 14.1 11.5 - 15.5 %  ? Platelets 252 150 - 400 K/uL  ? nRBC 0.0 0.0 - 0.2 %  ?  Comment: Performed at [facility], [address] [place], [place] [address]  ?ABO/Rh     Status: None  ? Collection Time: 02/04/22 11:29 AM  ?Result Value Ref Range  ? ABO/RH(D) B POS   ? No rh immune globuloin    ?  NOT A RH IMMUNE GLOBULIN CANDIDATE, PT RH POSITIVE ?Performed at [facility], [address] [place], [place] [address] ?  ?hCG, quantitative, pregnancy     Status: Abnormal  ? Collection Time: 02/04/22 11:29 AM  ?Result Value Ref Range  ? hCG, Beta Chain, Quant, S 7,899 (H) <5 mIU/mL  ?  Comment:        ?  GEST. AGE      CONC.  (mIU/mL) ?  <=1 WEEK        5 - 50 ?    2 WEEKS       50 - 500 ?    3 WEEKS       100 - 10,000 ?    4 WEEKS     1,000 - 30,000 ?    5 WEEKS     3,500 - 115,000 ?  6-8 WEEKS     12,000 - 270,000 ?   12 WEEKS     15,000 - 220,000 ?       ?FEMALE AND NON-PREGNANT FEMALE: ?    LESS THAN 5 mIU/mL ?Performed at [facility], [address] [place], [place] [address] ?  ?Comprehensive metabolic panel     Status: None  ? Collection Time: 02/04/22 11:29 AM  ?Result Value Ref Range  ? Sodium 137 135 - 145 mmol/L  ? Potassium 3.6 3.5 - 5.1 mmol/L  ? Chloride 106 98 - 111 mmol/L  ? CO2 23 22 - 32 mmol/L  ? Glucose, Bld 82 70 - 99 mg/dL  ?  Comment: Glucose reference range applies only to samples taken after fasting for at least 8 hours.  ? BUN 10 6 - 20 mg/dL  ? Creatinine, Ser 0.89  0.44 - 1.00 mg/dL  ? Calcium 9.1 8.9 - 10.3 mg/dL  ? Total Protein 7.1 6.5 - 8.1 g/dL  ? Albumin 4.2 3.5 - 5.0 g/dL  ? AST 20 15 - 41 U/L  ? ALT 15 0 - 44 U/L  ?  Alkaline Phosphatase 59 38 - 126 U/L  ? Total Bilirubin 0.6 0.3 - 1.2 mg/dL  ? GFR, Estimated >60 >60 mL/min  ?  Comment: (NOTE) ?Calculated using the CKD-EPI Creatinine Equation (2021) ?  ? Anion gap 8 5 - 15  ?  Comment: Performed at [facility], [address], [place], [place] [address]  ?Urinalysis, Routine w reflex microscopic Urine, Clean Catch     Status: Abnormal  ? Collection Time: 02/04/22 11:36 AM  ?Result Value Ref Range  ? Color, Urine YELLOW YELLOW  ? APPearance HAZY (A) CLEAR  ? Specific Gravity, Urine 1.021 1.005 - 1.030  ? pH 5.0 5.0 - 8.0  ? Glucose, UA NEGATIVE NEGATIVE mg/dL  ? Hgb urine dipstick NEGATIVE NEGATIVE  ? Bilirubin Urine NEGATIVE NEGATIVE  ? Ketones, ur 5 (A) NEGATIVE mg/dL  ? Protein, ur NEGATIVE NEGATIVE mg/dL  ? Nitrite NEGATIVE NEGATIVE  ? Leukocytes,Ua NEGATIVE NEGATIVE  ?  Comment: Performed at [facility], [address], [place], [place] [address]  ?  ? ?--/--/B POS (04/04 1129) ? ?IMAGING ?US OB LESS THAN 14 WEEKS WITH OB TRANSVAGINAL ? ?Result Date: 02/05/2022 ?CLINICAL DATA:  Pelvic cramping and vaginal spotting. Positive pregnancy test. EXAM: OBSTETRIC <14 WK [place] AND TRANSVAGINAL OB US TECHNIQUE: Both transabdominal and transvaginal ultrasound examinations were performed for complete evaluation of the gestation as well as the maternal uterus, adnexal regions, and pelvic cul-de-sac. Transvaginal technique was performed to assess early pregnancy. COMPARISON:  None. FINDINGS: Intrauterine gestational sac: None Maternal uterus/adnexae: Right ovary is normal in appearance. A living ectopic pregnancy is seen in the left adnexa adjacent to the left ovary. The ectopic gestational sac contains a yolk sac and embryo, with embryonic cardiac activity of 119 bpm. Embryonic crown-rump length measures 6 mm, corresponding with gestational age of [redacted] weeks 3 days. A tiny amount of simple free fluid is noted in the pelvic cul-de-sac. IMPRESSION: Living ectopic pregnancy in the  left adnexa, as described above. Critical Value/emergent results were called by telephone at the time of interpretation on 02/05/2022 at 11:37 am to provider Taylyn Brame LEFTWICH-KIRBY , who verbally acknowledged these results.

## 2022-02-05 NOTE — Discharge Instructions (Signed)
HOME INSTRUCTIONS ? ?Please note any unusual or excessive bleeding, pain, swelling. Mild dizziness or drowsiness are normal for about 24 hours after surgery. ?  ?Shower when comfortable ? ?Restrictions: No driving for 24 hours or while taking pain medications. ? ?Activity:  No heavy lifting (> 10 lbs), nothing in vagina (no tampons, douching, or intercourse) x 4 weeks; no tub baths for 4 weeks ?Vaginal spotting is expected but if your bleeding is heavy, period like,  please call the office ?  ?Incision: the bandaids will fall off when they are ready to; you may clean your incision with mild soap and water but do not rub or scrub the incision site.  You may experience slight bloody drainage from your incision periodically.  This is normal.  If you experience a large amount of drainage or the incision opens, please call your physician who will likely direct you to the emergency department. ? ?Diet:  You may return to your regular diet.  Do not eat large meals.  Eat small frequent meals throughout the day.  Continue to drink a good amount of water at least 6-8 glasses of water per day, hydration is very important for the healing process. ? ?Pain Management: Take Ibuprofen and/or tylenol every 6 hours with food as needed for pain.  Please alternate taking between these medications that way you can take some type of medication if needed every 3 hours.  For severe pain and breakthrough pain, a prescription of oxycodone has been sent in. Please take this medication along with the tylenol.  Always take prescription pain medication with food.   ? ?Oxycodone may cause constipation, you may want to take a stool softener while taking this medication.  A prescription of colace has been sent in to take twice daily if needed while taking the oxycodone.  Be sure to drink plenty of fluids and increase your fiber to help with constipation. ? ?Alcohol -- Avoid for 24 hours and while taking pain medications. ? ?Nausea: Take sips of  ginger ale or soda ? ?Fever -- Call physician if temperature over 101 degrees ? ?Follow up:  If you do not already have a follow up appointment scheduled, please call the office at [phone].  If you experience fever (a temperature greater than 100.4), pain unrelieved by pain medication, shortness of breath, swelling of a single leg, or any other symptoms which are concerning to you please the office immediately.   ?

## 2022-02-05 NOTE — Transfer of Care (Signed)
Immediate Anesthesia Transfer of Care Note ? ?Patient: Sheila Kelly ? ?Procedure(s) Performed: DIAGNOSTIC LAPAROSCOPY, LEFT SALPINGECTOMY WITH REMOVAL OF ECTOPIC PREGNANCY ? ?Patient Location: PACU ? ?Anesthesia Type:General ? ?Level of Consciousness: awake, alert  and oriented ? ?Airway & Oxygen Therapy: Patient Spontanous Breathing and Patient connected to nasal cannula oxygen ? ?Post-op Assessment: Report given to RN, Post -op Vital signs reviewed and stable and Patient moving all extremities X 4 ? ?Post vital signs: Reviewed and stable ? ?Last Vitals:  ?Vitals Value Taken Time  ?BP 122/73 02/05/22 2200  ?Temp    ?Pulse 106 02/05/22 2202  ?Resp 16 02/05/22 2202  ?SpO2 85 % 02/05/22 2202  ?Vitals shown include unvalidated device data. ? ?Last Pain:  ?Vitals:  ? 02/05/22 1653  ?TempSrc: Oral  ?   ? ?  ? ?Complications: No notable events documented. ?

## 2022-02-05 NOTE — Anesthesia Postprocedure Evaluation (Signed)
Anesthesia Post Note ? ?Patient: Sheila Kelly ? ?Procedure(s) Performed: DIAGNOSTIC LAPAROSCOPY, LEFT SALPINGECTOMY WITH REMOVAL OF ECTOPIC PREGNANCY ? ?  ? ?Patient location during evaluation: PACU ?Anesthesia Type: General ?Level of consciousness: awake and alert ?Pain management: pain level controlled ?Vital Signs Assessment: post-procedure vital signs reviewed and stable ?Respiratory status: spontaneous breathing, nonlabored ventilation and respiratory function stable ?Cardiovascular status: stable and blood pressure returned to baseline ?Anesthetic complications: no ? ? ?No notable events documented. ? ?Last Vitals:  ?Vitals:  ? 02/05/22 2245 02/05/22 2315  ?BP: 113/66 108/67  ?Pulse: (!) 51 (!) 56  ?Resp: 13 20  ?Temp:  37.2 ?C  ?SpO2: 100% 98%  ?  ?Last Pain:  ?Vitals:  ? 02/05/22 2300  ?TempSrc:   ?PainSc: Asleep  ? ? ?  ?  ?  ?  ?  ?  ? ?Beryle Lathe ? ? ? ? ?

## 2022-02-05 NOTE — Anesthesia Preprocedure Evaluation (Addendum)
Anesthesia Evaluation  ?Patient identified by MRN, date of birth, ID band ?Patient awake ? ? ? ?Reviewed: ?Allergy & Precautions, NPO status , Patient's Chart, lab work & pertinent test results ? ?History of Anesthesia Complications ?Negative for: history of anesthetic complications ? ?Airway ?Mallampati: II ? ?TM Distance: >3 FB ?Neck ROM: Full ? ? ? Dental ? ?(+) Dental Advisory Given ?  ?Pulmonary ?asthma , Current Smoker and Patient abstained from smoking.,  ?  ?Pulmonary exam normal ? ? ? ? ? ? ? Cardiovascular ?negative cardio ROS ?Normal cardiovascular exam ? ? ?  ?Neuro/Psych ?negative neurological ROS ? negative psych ROS  ? GI/Hepatic ?negative GI ROS, (+)  ?  ? substance abuse ? marijuana use,   ?Endo/Other  ?negative endocrine ROS ? Renal/GU ?negative Renal ROS  ? ?  ?Musculoskeletal ?negative musculoskeletal ROS ?(+)  ? Abdominal ?  ?Peds ? Hematology ? ?(+) Blood dyscrasia, anemia ,   ?Anesthesia Other Findings ? ? Reproductive/Obstetrics ? ?Ectopic pregnancy ? ? ?  ? ? ? ? ? ? ? ? ? ? ? ? ? ?  ?  ? ? ? ? ? ? ? ?Anesthesia Physical ?Anesthesia Plan ? ?ASA: 2 ? ?Anesthesia Plan: General  ? ?Post-op Pain Management: Tylenol PO (pre-op)* and Celebrex PO (pre-op)*  ? ?Induction: Intravenous ? ?PONV Risk Score and Plan: 2 and Treatment may vary due to age or medical condition, Ondansetron, Dexamethasone and Midazolam ? ?Airway Management Planned: Oral ETT ? ?Additional Equipment: None ? ?Intra-op Plan:  ? ?Post-operative Plan: Extubation in OR ? ?Informed Consent: I have reviewed the patients History and Physical, chart, labs and discussed the procedure including the risks, benefits and alternatives for the proposed anesthesia with the patient or authorized representative who has indicated his/her understanding and acceptance.  ? ? ? ?Dental advisory given ? ?Plan Discussed with: CRNA and Anesthesiologist ? ?Anesthesia Plan Comments:   ? ? ? ? ? ?Anesthesia Quick  Evaluation ? ?

## 2022-02-05 NOTE — Anesthesia Procedure Notes (Signed)
Procedure Name: Intubation ?Date/Time: 02/05/2022 8:26 PM ?Performed by: Suzy Bouchard, CRNA ?Pre-anesthesia Checklist: Patient identified, Emergency Drugs available, Suction available, Patient being monitored and Timeout performed ?Patient Re-evaluated:Patient Re-evaluated prior to induction ?Oxygen Delivery Method: Circle system utilized ?Preoxygenation: Pre-oxygenation with 100% oxygen ?Induction Type: IV induction and Rapid sequence ?Laryngoscope Size: Sabra Heck and 2 ?Grade View: Grade I ?Tube type: Oral ?Tube size: 7.0 mm ?Number of attempts: 1 ?Airway Equipment and Method: Stylet ?Placement Confirmation: ETT inserted through vocal cords under direct vision, positive ETCO2 and breath sounds checked- equal and bilateral ?Secured at: 23 cm ?Tube secured with: Tape ?Dental Injury: Teeth and Oropharynx as per pre-operative assessment  ? ? ? ? ?

## 2022-02-06 ENCOUNTER — Encounter (HOSPITAL_COMMUNITY): Payer: Self-pay | Admitting: Obstetrics & Gynecology

## 2022-02-06 ENCOUNTER — Telehealth: Payer: Self-pay | Admitting: Obstetrics & Gynecology

## 2022-02-06 NOTE — Telephone Encounter (Signed)
Spoke with patient, since leaving message she has passed gas. Recommended pushing fluids and taking colace to help with bowel movements while she is on pain meds Patient voiced understanding and had no other questions at this time.  ?

## 2022-02-06 NOTE — Telephone Encounter (Signed)
Patient called stating that she would like to speak with Dr. Despina Hidden or his nurse. Patient states she had surgery yesterday and she has not been able to poop or pass gas. Patient would like to know if this is normal. Lease Contact  pt ?

## 2022-02-07 LAB — SURGICAL PATHOLOGY

## 2022-02-10 ENCOUNTER — Telehealth: Payer: Self-pay | Admitting: Family Medicine

## 2022-02-10 ENCOUNTER — Telehealth: Payer: Self-pay

## 2022-02-10 NOTE — Telephone Encounter (Signed)
Called patient to inform her of appointment scheduled for post-op, there was no answer to the phone call so a voicemail was left with the call back number for the office and a letter was mailed.  ?

## 2022-02-10 NOTE — Telephone Encounter (Signed)
Pt report returning our call.  Pt has questions about surgery.   ? ?Maveric Debono,RN  ?02/10/22 ?

## 2022-02-11 NOTE — Telephone Encounter (Signed)
Called pt; pt reports vaginal bleeding that is not as heavy as a period over the past few days. Reviewed bleeding and infection precautions with patient. Pt asks about returning to work tomorrow. Reviewed discharge instructions with patient including no driving while on pain medication and no lifting greater than 10 lbs.  ?

## 2022-02-18 ENCOUNTER — Ambulatory Visit: Payer: BC Managed Care – PPO | Admitting: Obstetrics and Gynecology

## 2022-10-07 IMAGING — DX DG WRIST COMPLETE 3+V*R*
4 series · 4 of 4 positions shown · non-contrast
Comparison: None.

CLINICAL DATA: Atraumatic right wrist pain

EXAM:
RIGHT WRIST - COMPLETE 3+ VIEW

[wrist ap (1 of 2)]
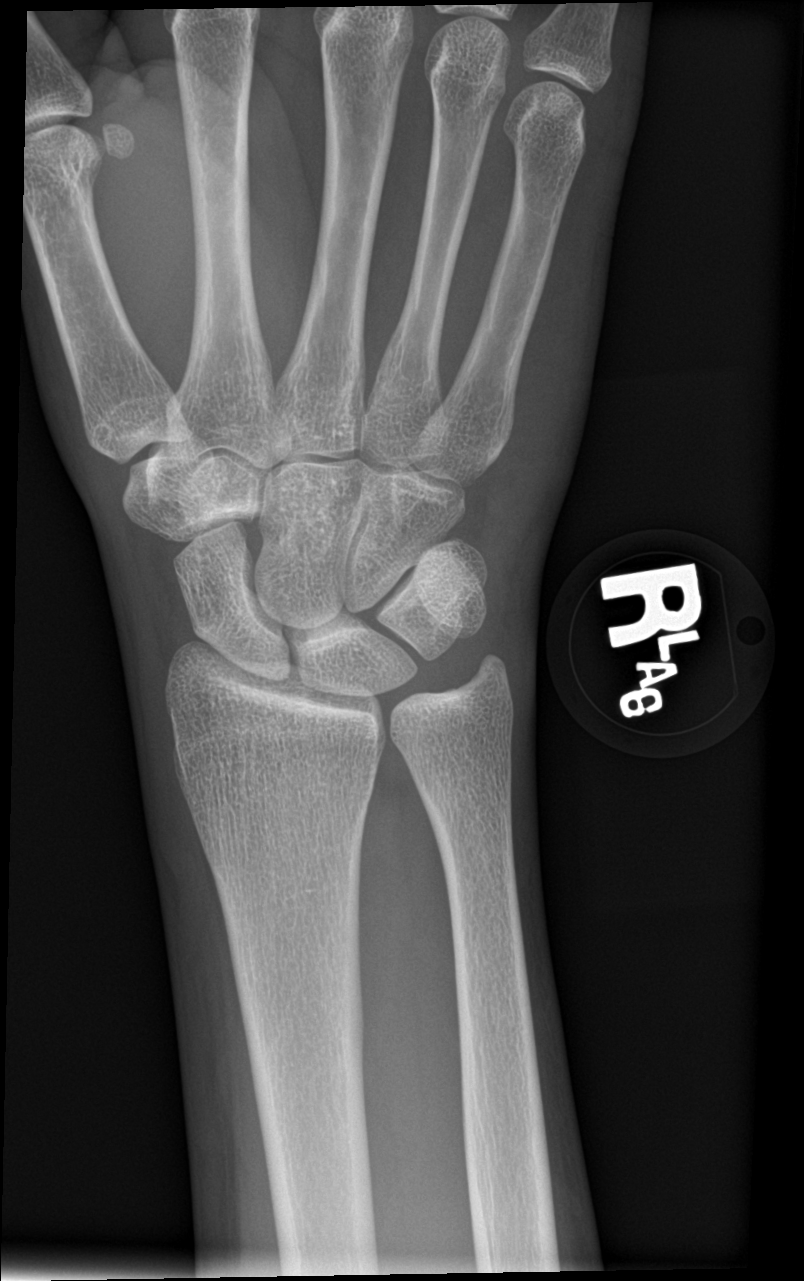

[wrist obl]
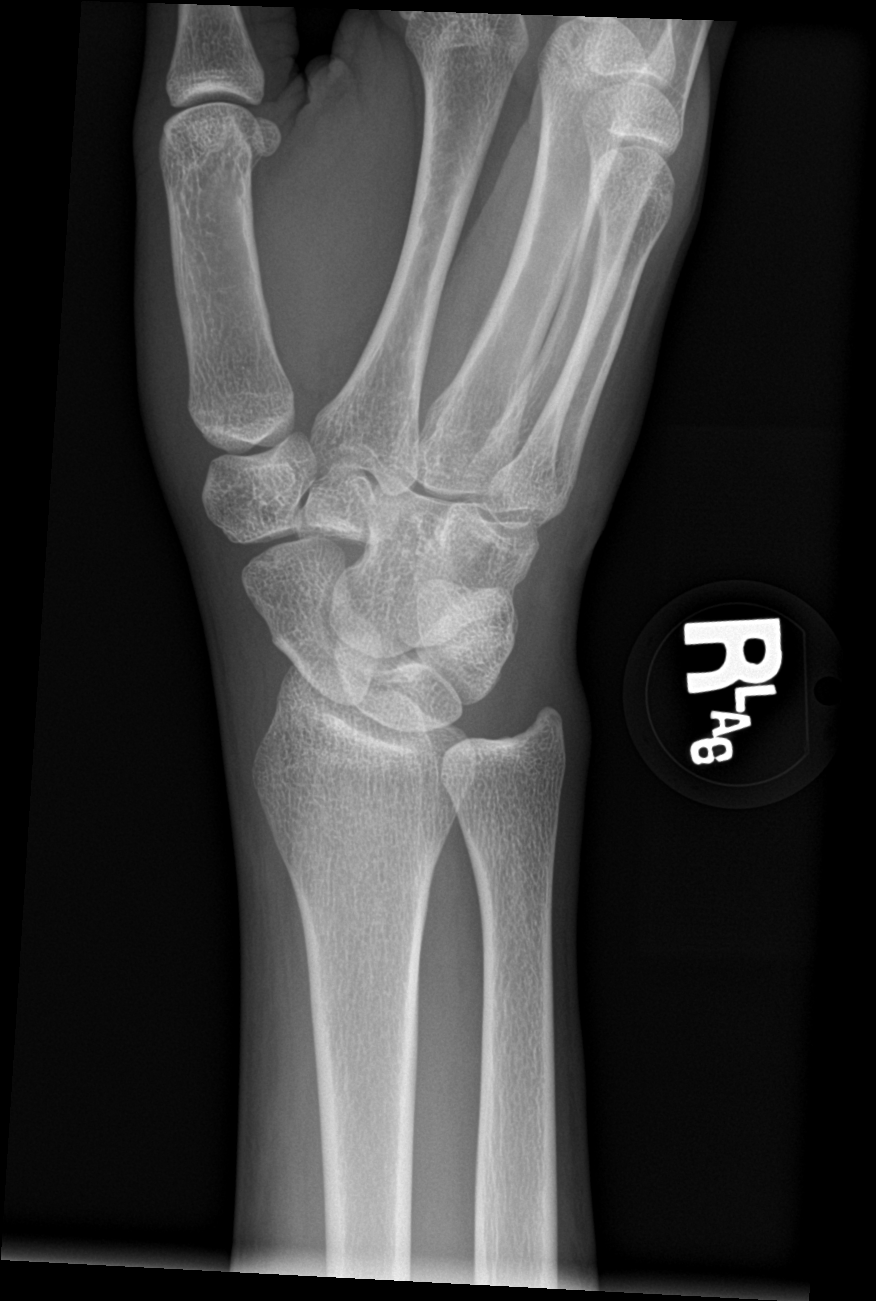

[wrist lat]
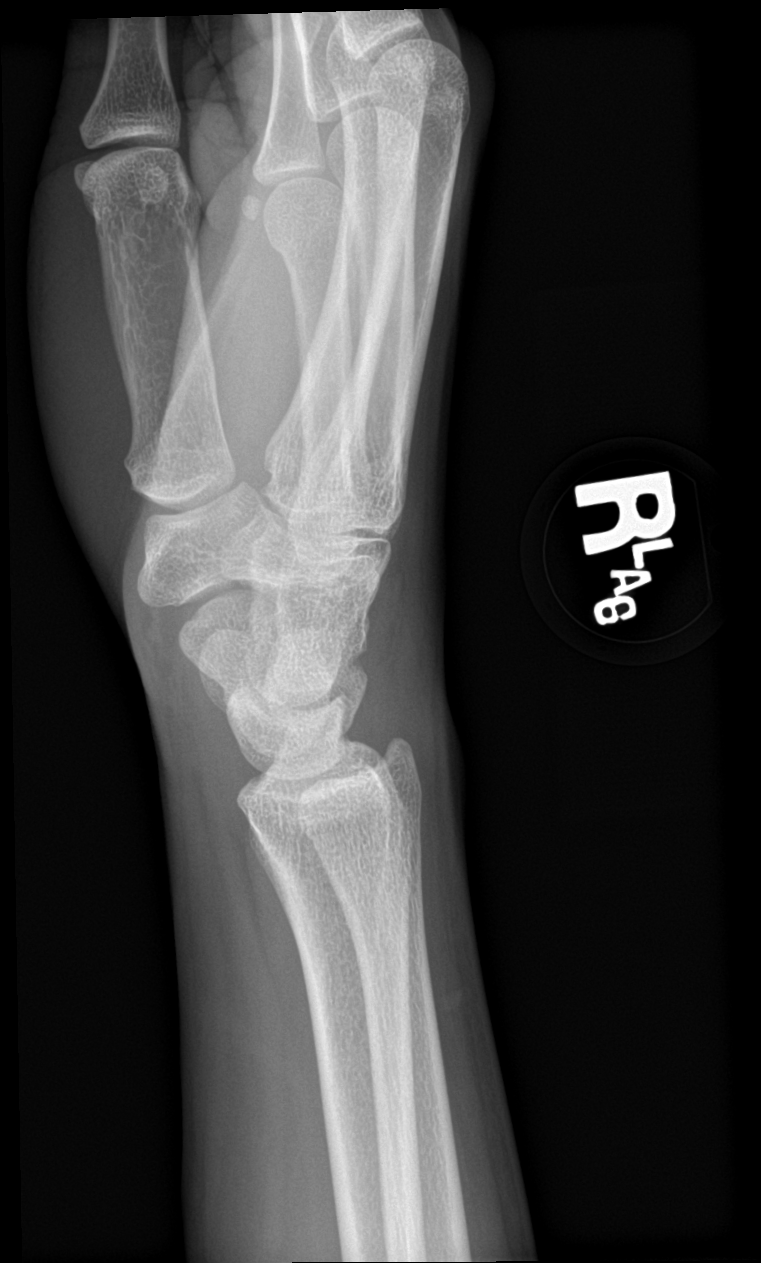

[wrist ap (2 of 2)]
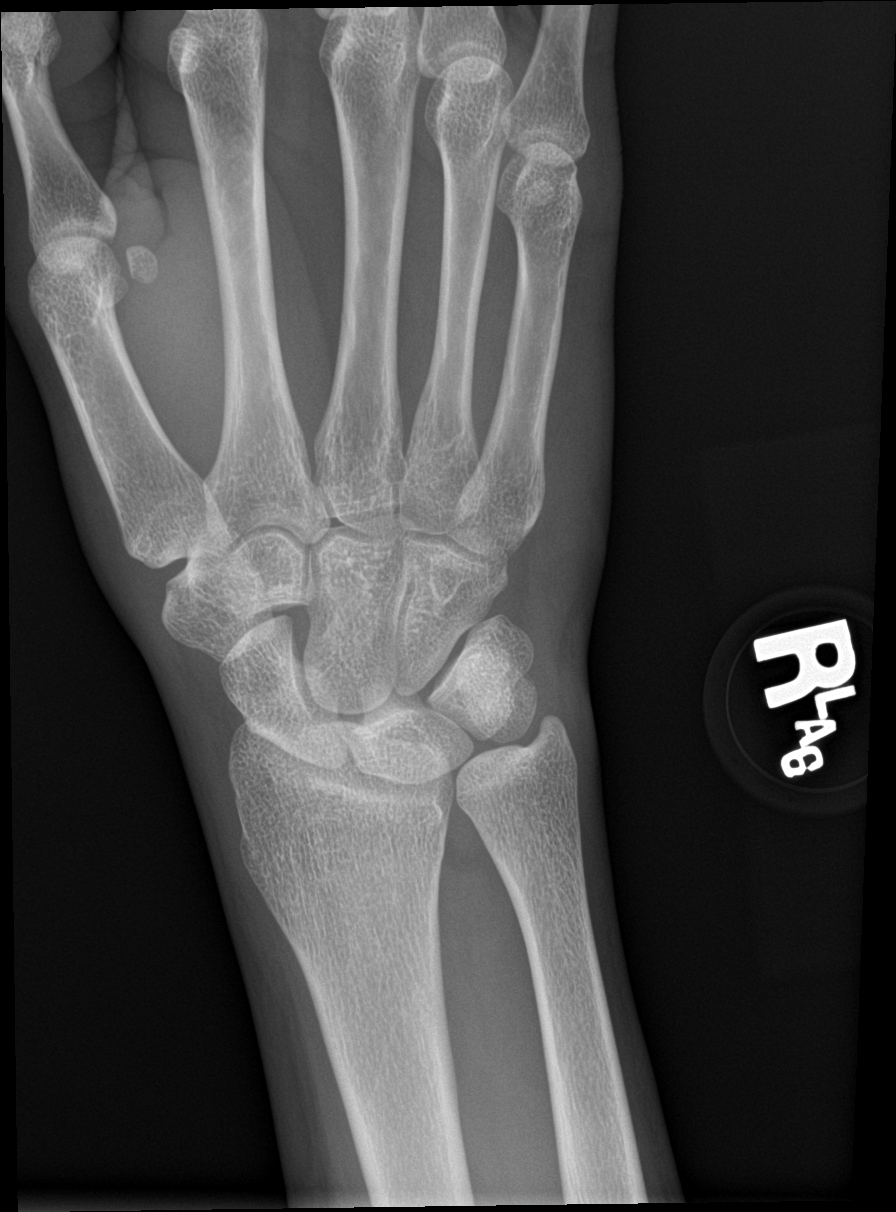

[4 of 4 positions shown; findings below may reference images not displayed]

FINDINGS: There is no evidence of fracture or dislocation. There is no
evidence of arthropathy or other focal bone abnormality. Soft
tissues are unremarkable.
IMPRESSION: Negative.

## 2023-03-13 ENCOUNTER — Ambulatory Visit (INDEPENDENT_AMBULATORY_CARE_PROVIDER_SITE_OTHER): Payer: Self-pay

## 2023-03-13 ENCOUNTER — Encounter (HOSPITAL_COMMUNITY): Payer: Self-pay

## 2023-03-13 ENCOUNTER — Ambulatory Visit (HOSPITAL_COMMUNITY)
Admission: EM | Admit: 2023-03-13 | Discharge: 2023-03-13 | Disposition: A | Discharge status: Home / Self Care | Payer: Self-pay | Attending: Emergency Medicine | Admitting: Emergency Medicine

## 2023-03-13 DIAGNOSIS — M79641 Pain in right hand: Secondary | ICD-10-CM

## 2023-03-13 DIAGNOSIS — L089 Local infection of the skin and subcutaneous tissue, unspecified: Secondary | ICD-10-CM

## 2023-03-13 DIAGNOSIS — Z23 Encounter for immunization: Secondary | ICD-10-CM

## 2023-03-13 DIAGNOSIS — S6991XA Unspecified injury of right wrist, hand and finger(s), initial encounter: Secondary | ICD-10-CM

## 2023-03-13 MED ORDER — IBUPROFEN 800 MG PO TABS
800.0000 mg | ORAL_TABLET | Freq: Once | ORAL | Status: AC
  Administered 2023-03-13: 800 mg via ORAL

## 2023-03-13 MED ORDER — TETANUS-DIPHTH-ACELL PERTUSSIS 5-2.5-18.5 LF-MCG/0.5 IM SUSY
0.5000 mL | PREFILLED_SYRINGE | Freq: Once | INTRAMUSCULAR | Status: AC
  Administered 2023-03-13: 0.5 mL via INTRAMUSCULAR

## 2023-03-13 MED ORDER — IBUPROFEN 800 MG PO TABS
ORAL_TABLET | ORAL | Status: AC
  Filled 2023-03-13: qty 1

## 2023-03-13 MED ORDER — CEPHALEXIN 500 MG PO CAPS: 500.0000 mg | ORAL_CAPSULE | Freq: Four times a day (QID) | ORAL | 0 refills | Status: AC

## 2023-03-13 MED ORDER — TETANUS-DIPHTH-ACELL PERTUSSIS 5-2.5-18.5 LF-MCG/0.5 IM SUSY
PREFILLED_SYRINGE | INTRAMUSCULAR | Status: AC
  Filled 2023-03-13: qty 0.5

## 2023-03-13 NOTE — ED Provider Notes (Signed)
[facility]    CSN: [id number] Arrival date & time: 03/13/23  1046      History   Chief Complaint Chief Complaint  Patient presents with   Hand Pain    HPI Sheila Kelly is a 22 y.o. female.   Patient presents to clinic for right hand pain that has been ongoing since she cut it on glass a week ago.  She reports throwing a lamp about a week ago and breaking.  She was picking up the glass when she cut her right palm.  She initially cleaned it out with water.  Since then has had pain, swelling and inflammation.  She is concerned that there is retained glass.   Denies fevers. Pain with moving fingers.   Unsure of last Tdap.   The history is provided by the patient and medical records.  Hand Pain    Past Medical History:  Diagnosis Date   Asthma    Prematurity     Patient Active Problem List   Diagnosis Date Noted   Left tubal ectopic pregnancy 02/05/2022    Past Surgical History:  Procedure Laterality Date   DIAGNOSTIC LAPAROSCOPY WITH REMOVAL OF ECTOPIC PREGNANCY N/A 02/05/2022   Procedure: DIAGNOSTIC LAPAROSCOPY, LEFT SALPINGECTOMY WITH REMOVAL OF ECTOPIC PREGNANCY;  Surgeon: Myna Hidalgo, DO;  Location: [facility];  Service: Gynecology;  Laterality: N/A;   PATENT DUCTUS ARTERIOUS REPAIR      OB History     Gravida  1   Para      Term      Preterm      AB      Living         SAB      IAB      Ectopic      Multiple      Live Births               Home Medications    Prior to Admission medications   Medication Sig Start Date End Date Taking? Authorizing Provider  cephALEXin (KEFLEX) 500 MG capsule Take 1 capsule (500 mg total) by mouth 4 (four) times daily. 03/13/23  Yes Rinaldo Ratel, [place] N, FNP  acetaminophen (TYLENOL) 325 MG tablet Take 2 tablets (650 mg total) by mouth every 6 (six) hours as needed. 02/05/22   Myna Hidalgo, DO  ibuprofen (ADVIL) 600 MG tablet Take 1 tablet (600 mg total) by mouth every 6 (six) hours as needed.  02/05/22   Myna Hidalgo, DO    Family History Family History  Problem Relation Age of Onset   Healthy Mother    Healthy Father     Social History Social History   Tobacco Use   Smoking status: Never   Smokeless tobacco: Never  Vaping Use   Vaping Use: Every day   Substances: Nicotine, Flavoring  Substance Use Topics   Alcohol use: Yes   Drug use: Yes    Types: Marijuana     Allergies   Patient has no known allergies.   Review of Systems Review of Systems  Constitutional:  Negative for fever.  Musculoskeletal:  Positive for joint swelling.     Physical Exam Triage Vital Signs ED Triage Vitals [03/13/23 1125]  Enc Vitals Group     BP (!) 151/85     Pulse Rate (!) 52     Resp 14     Temp 98.1 F (36.7 C)     Temp Source Oral     SpO2 98 %  Weight      Height      Head Circumference      Peak Flow      Pain Score 6     Pain Loc      Pain Edu?      Excl. in GC?    No data found.  Updated Vital Signs BP (!) 151/85 (BP Location: Left Arm)   Pulse (!) 52   Temp 98.1 F (36.7 C) (Oral)   Resp 14   LMP 02/20/2023   SpO2 98%   Visual Acuity Right Eye Distance:   Left Eye Distance:   Bilateral Distance:    Right Eye Near:   Left Eye Near:    Bilateral Near:     Physical Exam Vitals and nursing note reviewed.  Constitutional:      Appearance: Normal appearance.  HENT:     Head: Normocephalic and atraumatic.     Right Ear: External ear normal.     Left Ear: External ear normal.     Nose: Nose normal.     Mouth/Throat:     Mouth: Mucous membranes are moist.  Eyes:     Conjunctiva/sclera: Conjunctivae normal.  Cardiovascular:     Rate and Rhythm: Normal rate.  Pulmonary:     Effort: Pulmonary effort is normal. No respiratory distress.  Musculoskeletal:        General: Swelling, tenderness and signs of injury present.     Right hand: Tenderness present. No deformity. Decreased range of motion. There is no disruption of two-point  discrimination. Normal capillary refill. Normal pulse.     Comments: Swelling to right palm over third metatarsal with hardening and erythema. Fingers with swelling and pain to ROM.   Skin:    Capillary Refill: Capillary refill takes less than 2 seconds.  Neurological:     General: No focal deficit present.     Mental Status: She is alert and oriented to person, place, and time.  Psychiatric:        Mood and Affect: Mood normal.        Behavior: Behavior normal. Behavior is cooperative.      UC Treatments / Results  Labs (all labs ordered are listed, but only abnormal results are displayed) Labs Reviewed - No data to display  EKG   Radiology DG Hand Complete Right  Result Date: 03/13/2023 CLINICAL DATA:  Pain after trauma.  Laceration. EXAM: RIGHT HAND - COMPLETE 3 VIEW COMPARISON:  None Available. FINDINGS: No fracture or dislocation. Preserved joint spaces and bone mineralization. There is a punctate density along the nail of the thumb. Please correlate with clinical findings. Otherwise no clear definite radiopaque foreign body. Please correlate with exact location of soft tissue injury. IMPRESSION: No acute osseous abnormality. There is a punctate density along the nail of the thumb. Please correlate with clinical findings. Otherwise no clear definite radiopaque foreign body. Please correlate with exact location of soft tissue injury. Electronically Signed   By: Karen Kays M.D.   On: 03/13/2023 12:01    Procedures Procedures (including critical care time)  Medications Ordered in UC Medications  Tdap (BOOSTRIX) injection 0.5 mL (has no administration in time range)  ibuprofen (ADVIL) tablet 800 mg (has no administration in time range)    Initial Impression / Assessment and Plan / UC Course  I have reviewed the triage vital signs and the nursing notes.  Pertinent labs & imaging results that were available during my care of the patient were reviewed by  me and considered in my  medical decision making (see chart for details).  Vitals and triage reviewed, patient is hemodynamically stable.  Right hand injury about a week ago, potential for retained foreign body.  Imaging negative for foreign body.  Will cover with Keflex due to erythema, pain and swelling, concern for infection.  Encouraged to follow-up with [facility] if symptoms persist for further evaluation.  Plan of care, follow-up and return precautions reviewed, no questions at this time.     Final Clinical Impressions(s) / UC Diagnoses   Final diagnoses:  Right hand pain  Infection of right hand  Hand injury, right, initial encounter     Discharge Instructions      Your imaging was negative for any retained foreign body.  I am putting you on antibiotics over concern for infection to your hand.  Please take all antibiotics as prescribed until finished can take them with food to help prevent gastrointestinal upset.  You can alternate between Tylenol and ibuprofen every 4-6 hours as needed for pain and inflammation.  I advise following up with [facility] for further evaluation if your pain persist despite antibiotics.  Please return to clinic for any new or concerning symptoms.    ED Prescriptions     Medication Sig Dispense Auth. Provider   cephALEXin (KEFLEX) 500 MG capsule Take 1 capsule (500 mg total) by mouth 4 (four) times daily. 20 capsule Natascha Edmonds, [place] N, [place]      PDMP not reviewed this encounter.   Graeson Nouri, [place] N, [place] 03/13/23 1217

## 2023-03-13 NOTE — Discharge Instructions (Addendum)
Your imaging was negative for any retained foreign body.  I am putting you on antibiotics over concern for infection to your hand.  Please take all antibiotics as prescribed until finished can take them with food to help prevent gastrointestinal upset.  You can alternate between Tylenol and ibuprofen every 4-6 hours as needed for pain and inflammation.  I advise following up with [facility] for further evaluation if your pain persist despite antibiotics.  Please return to clinic for any new or concerning symptoms.

## 2023-03-13 NOTE — ED Triage Notes (Signed)
Patient states she threw a lamp a week ago and it broke. Patient stes she picked up the glass and now thinks she has a piece of glass in the palm of her right hand.  Patient states she took Tylenol yesterday for her pain.

## 2023-03-18 ENCOUNTER — Emergency Department (HOSPITAL_COMMUNITY)
Admission: EM | Admit: 2023-03-18 | Discharge: 2023-03-18 | Disposition: A | Discharge status: Home / Self Care | Payer: Self-pay | Attending: Emergency Medicine | Admitting: Emergency Medicine

## 2023-03-18 ENCOUNTER — Other Ambulatory Visit: Payer: Self-pay

## 2023-03-18 DIAGNOSIS — L0291 Cutaneous abscess, unspecified: Secondary | ICD-10-CM

## 2023-03-18 DIAGNOSIS — L02511 Cutaneous abscess of right hand: Secondary | ICD-10-CM | POA: Insufficient documentation

## 2023-03-18 MED ORDER — DOXYCYCLINE HYCLATE 100 MG PO CAPS: 100.0000 mg | ORAL_CAPSULE | Freq: Two times a day (BID) | ORAL | 0 refills | Status: AC

## 2023-03-18 NOTE — ED Provider Notes (Signed)
[place] EMERGENCY DEPARTMENT AT [facility] Provider Note   CSN: [id number] Arrival date & time: 03/18/23  1631     History  Chief Complaint  Patient presents with   Wound Check    Sheila Kelly is a 22 y.o. female presented to emergency department complaining of concern for infection.  Patient reports about 2 weeks ago she cut her right palm on a piece of glass.  She went to urgent care and was started on Keflex for that.  She completed a course of Keflex 2 days ago but is concerned that she has a painless discolored bump that is formed over the cut site on her right hand.  She denies any pain, fevers.  No difficulty using her hand or fingers.  She did have x-rays performed in urgent care, which I reviewed, which showed no retained foreign body.  HPI     Home Medications Prior to Admission medications   Medication Sig Start Date End Date Taking? Authorizing Provider  doxycycline (VIBRAMYCIN) 100 MG capsule Take 1 capsule (100 mg total) by mouth 2 (two) times daily for 7 days. 03/18/23 03/25/23 Yes Machi Whittaker, Kermit Balo, MD  acetaminophen (TYLENOL) 325 MG tablet Take 2 tablets (650 mg total) by mouth every 6 (six) hours as needed. 02/05/22   Myna Hidalgo, DO  cephALEXin (KEFLEX) 500 MG capsule Take 1 capsule (500 mg total) by mouth 4 (four) times daily. 03/13/23   Garrison, [place] N, FNP  ibuprofen (ADVIL) 600 MG tablet Take 1 tablet (600 mg total) by mouth every 6 (six) hours as needed. 02/05/22   Myna Hidalgo, DO      Allergies    Patient has no known allergies.    Review of Systems   Review of Systems  Physical Exam Updated Vital Signs BP 137/72   Pulse 75   Temp 98.3 F (36.8 C) (Oral)   Resp 16   Ht 5\' 8"  (1.727 m)   Wt 49.9 kg   LMP 02/20/2023   SpO2 97%   BMI 16.73 kg/m  Physical Exam Constitutional:      General: She is not in acute distress. HENT:     Head: Normocephalic and atraumatic.  Eyes:     Conjunctiva/sclera: Conjunctivae normal.      Pupils: Pupils are equal, round, and reactive to light.  Cardiovascular:     Rate and Rhythm: Normal rate and regular rhythm.  Skin:    General: Skin is warm and dry.     Comments: Small 1 cm circular mildly indurated, nontender lesion on the middle palmar surface of right hand Patient is able to fully flex and extend all digits.  No streaking erythema from the wound site  Neurological:     General: No focal deficit present.     Mental Status: She is alert and oriented to person, place, and time. Mental status is at baseline.  Psychiatric:        Mood and Affect: Mood normal.        Behavior: Behavior normal.     ED Results / Procedures / Treatments   Labs (all labs ordered are listed, but only abnormal results are displayed) Labs Reviewed - No data to display  EKG None  Radiology No results found.  Procedures Fine needle aspiration  Date/Time: 03/18/2023 5:26 PM  Performed by: Terald Sleeper, MD Authorized by: Terald Sleeper, MD  Consent: Verbal consent obtained. Risks and benefits: risks, benefits and alternatives were discussed Patient understanding: patient states understanding  of the procedure being performed Site marked: the operative site was marked Imaging studies: imaging studies available Patient identity confirmed: arm band Time out: Immediately prior to procedure a "time out" was called to verify the correct patient, procedure, equipment, support staff and site/side marked as required. Preparation: Patient was prepped and draped in the usual sterile fashion. Local anesthesia used: yes Anesthesia method: topical spray.  Anesthesia: Local anesthesia used: yes  Sedation: Patient sedated: no  Patient tolerance: patient tolerated the procedure well with no immediate complications Comments: 1.5 cc pus aspirated       Medications Ordered in ED Medications - No data to display  ED Course/ Medical Decision Making/ A&P Clinical Course as of 03/18/23  1727  Wed Mar 18, 2023  1709 Patient reports no concern for pregnancy at this time. [MT]    Clinical Course User Index [MT] Sheria Rosello, Kermit Balo, MD                             Medical Decision Making Risk Prescription drug management.   Patient is here for persistent lesion near the site of the laceration 2 weeks ago.  Differential include a cystic lesion versus an abscess formation versus other.  I performed a bedside ultrasound I do not see any hyperechoic retained foreign body under the skin.  I also reviewed her external records including urgent care evaluation and x-ray and there is no report of retained foreign body at that time.  However the ultrasound did show a hypoechoic fluid collection directly under the skin, approximately 1 x 1 cm.  I discussed with the patient we opted for needle aspiration, and was able to aspirate about 1.5 cm of pus from this site.  I think this is small enough to be amenable to treatment with additional antibiotics, will prescribe doxycycline for better MRSA coverage.  Patient is in agreement.  No evidence of tenosynovitis or deep tracking infection at this time.  No evidence of sepsis.        Final Clinical Impression(s) / ED Diagnoses Final diagnoses:  Abscess    Rx / DC Orders ED Discharge Orders          Ordered    doxycycline (VIBRAMYCIN) 100 MG capsule  2 times daily        03/18/23 1709              Terald Sleeper, MD 03/18/23 1727

## 2023-03-18 NOTE — ED Triage Notes (Signed)
Pt arrived via POV. Pt c/o possible infection of wound that they have been treated w/abx for. There is a nickel sized blister on R palm.  AOx4

## 2023-12-21 ENCOUNTER — Ambulatory Visit (HOSPITAL_COMMUNITY)
Admission: EM | Admit: 2023-12-21 | Discharge: 2023-12-21 | Disposition: A | Discharge status: Home / Self Care | Payer: Self-pay | Attending: Physician Assistant | Admitting: Physician Assistant

## 2023-12-21 ENCOUNTER — Encounter (HOSPITAL_COMMUNITY): Payer: Self-pay | Admitting: *Deleted

## 2023-12-21 ENCOUNTER — Other Ambulatory Visit: Payer: Self-pay

## 2023-12-21 DIAGNOSIS — N898 Other specified noninflammatory disorders of vagina: Secondary | ICD-10-CM | POA: Insufficient documentation

## 2023-12-21 LAB — POCT URINE PREGNANCY: Preg Test, Ur: NEGATIVE

## 2023-12-21 LAB — POCT URINALYSIS DIP (MANUAL ENTRY)
Bilirubin, UA: NEGATIVE
Blood, UA: NEGATIVE
Glucose, UA: NEGATIVE mg/dL
Ketones, POC UA: NEGATIVE mg/dL
Nitrite, UA: NEGATIVE
Protein Ur, POC: NEGATIVE mg/dL
Spec Grav, UA: 1.02 (ref 1.010–1.025)
Urobilinogen, UA: 1 U/dL
pH, UA: 7 (ref 5.0–8.0)

## 2023-12-21 LAB — HIV ANTIBODY (ROUTINE TESTING W REFLEX): HIV Screen 4th Generation wRfx: NONREACTIVE

## 2023-12-21 NOTE — ED Notes (Signed)
No answer when pt called from lobby.  

## 2023-12-21 NOTE — ED Triage Notes (Signed)
PT reports sh emay have a UTI because of VAG discharge. Pt wants to be tested for STD and blood work.

## 2023-12-21 NOTE — ED Provider Notes (Signed)
[facility]    CSN: [id number] Arrival date & time: 12/21/23  1140      History   Chief Complaint Chief Complaint  Patient presents with   Vaginal Discharge    HPI Sheila Kelly is a 23 y.o. female.   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 [employer] She states her vaginal discharge has changed- states she is having increased volume of discharge and odor   She reports symptoms have been ongoing since Nov She denies dysuria but states she does have some urinary hesitancy   She reports she has a history of frequent UTIs She states she uses vagisil for odor and pH balance   Past Medical History:  Diagnosis Date   Asthma    Prematurity     Patient Active Problem List   Diagnosis Date Noted   Left tubal ectopic pregnancy 02/05/2022    Past Surgical History:  Procedure Laterality Date   DIAGNOSTIC LAPAROSCOPY WITH REMOVAL OF ECTOPIC PREGNANCY N/A 02/05/2022   Procedure: DIAGNOSTIC LAPAROSCOPY, LEFT SALPINGECTOMY WITH REMOVAL OF ECTOPIC PREGNANCY;  Surgeon: Myna Hidalgo, DO;  Location: [facility];  Service: Gynecology;  Laterality: N/A;   PATENT DUCTUS ARTERIOUS REPAIR      OB History     Gravida  1   Para      Term      Preterm      AB      Living         SAB      IAB      Ectopic      Multiple      Live Births               Home Medications    Prior to Admission medications   Medication Sig Start Date End Date Taking? Authorizing Provider  acetaminophen (TYLENOL) 325 MG tablet Take 2 tablets (650 mg total) by mouth every 6 (six) hours as needed. 02/05/22   Myna Hidalgo, DO  cephALEXin (KEFLEX) 500 MG capsule Take 1 capsule (500 mg total) by mouth 4 (four) times daily. 03/13/23   Garrison, [place] N, FNP  ibuprofen (ADVIL) 600 MG tablet Take 1 tablet (600 mg total) by mouth every 6 (six) hours as  needed. 02/05/22   Myna Hidalgo, DO    Family History Family History  Problem Relation Age of Onset   Healthy Mother    Healthy Father     Social History Social History   Tobacco Use   Smoking status: Never   Smokeless tobacco: Never  Vaping Use   Vaping status: Every Day   Substances: Nicotine, Flavoring  Substance Use Topics   Alcohol use: Yes   Drug use: Yes    Types: Marijuana     Allergies   Patient has no known allergies.   Review of Systems Review of Systems  Gastrointestinal:  Positive for abdominal pain.  Genitourinary:  Positive for vaginal discharge. Negative for difficulty urinating, dysuria, flank pain, frequency, urgency, vaginal bleeding and vaginal pain.     Physical Exam Triage Vital Signs ED Triage Vitals  Encounter Vitals Group     BP 12/21/23 1420 116/63     Systolic BP Percentile --      Diastolic BP Percentile --      Pulse Rate 12/21/23 1420 66     Resp 12/21/23  1420 18     Temp 12/21/23 1420 98.3 F (36.8 C)     Temp src --      SpO2 12/21/23 1420 100 %     Weight --      Height --      Head Circumference --      Peak Flow --      Pain Score 12/21/23 1418 0     Pain Loc --      Pain Education --      Exclude from Growth Chart --    No data found.  Updated Vital Signs BP 116/63   Pulse 66   Temp 98.3 F (36.8 C)   Resp 18   LMP 11/23/2023 (Approximate)   SpO2 100%   Visual Acuity Right Eye Distance:   Left Eye Distance:   Bilateral Distance:    Right Eye Near:   Left Eye Near:    Bilateral Near:     Physical Exam Vitals reviewed.  Constitutional:      General: She is awake.     Appearance: Normal appearance. She is well-developed and well-groomed.  HENT:     Head: Normocephalic and atraumatic.  Eyes:     General: Lids are normal. Gaze aligned appropriately.     Extraocular Movements: Extraocular movements intact.     Conjunctiva/sclera: Conjunctivae normal.  Pulmonary:     Effort: Pulmonary effort is  normal.  Musculoskeletal:     Cervical back: Normal range of motion and neck supple.  Neurological:     General: No focal deficit present.     Mental Status: She is alert and oriented to person, place, and time.     GCS: GCS eye subscore is 4. GCS verbal subscore is 5. GCS motor subscore is 6.     Cranial Nerves: No cranial nerve deficit, dysarthria or facial asymmetry.  Psychiatric:        Attention and Perception: Attention and perception normal.        Mood and Affect: Mood and affect normal.        Speech: Speech normal.        Behavior: Behavior normal. Behavior is cooperative.      UC Treatments / Results  Labs (all labs ordered are listed, but only abnormal results are displayed) Labs Reviewed  POCT URINALYSIS DIP (MANUAL ENTRY) - Abnormal; Notable for the following components:      Result Value   Leukocytes, UA Small (1+) (*)    All other components within normal limits  URINE CULTURE  RPR  HIV ANTIBODY (ROUTINE TESTING W REFLEX)  POCT URINE PREGNANCY  CERVICOVAGINAL ANCILLARY ONLY    EKG   Radiology No results found.  Procedures Procedures (including critical care time)  Medications Ordered in UC Medications - No data to display  Initial Impression / Assessment and Plan / UC Course  I have reviewed the triage vital signs and the nursing notes.  Pertinent labs & imaging results that were available during my care of the patient were reviewed by me and considered in my medical decision making (see chart for details).      Final Clinical Impressions(s) / UC Diagnoses   Final diagnoses:  Vaginal discharge   Acute, new concern Patient reports that she is having changes to her vaginal discharge, suprapubic pain since November. Urine dip was notable for small amount of leukocytes.  Suspect that this is likely secondary to her vaginal discharge changes as there are no other pertinent findings Urine  pregnancy was negative.  Results of cervicovaginal swab and  blood work to dictate further management.  Follow-up as needed for progressing or persistent symptoms    Discharge Instructions      Your urine pregnancy test was negative.  I have sent off a sample of your urine for culture.  This will be back in 1 to 3 days.  If it appears that we need to send in an antibiotic for UTI and will be sent in for you.  We will keep you updated on the results of your cervicovaginal swab and your blood work.  If medication is indicated by those results and will be sent in once available.     ED Prescriptions   None    PDMP not reviewed this encounter.   Providence Crosby, PA-C 12/21/23 1641

## 2023-12-21 NOTE — Discharge Instructions (Addendum)
Your urine pregnancy test was negative.  I have sent off a sample of your urine for culture.  This will be back in 1 to 3 days.  If it appears that we need to send in an antibiotic for UTI and will be sent in for you.  We will keep you updated on the results of your cervicovaginal swab and your blood work.  If medication is indicated by those results and will be sent in once available.

## 2023-12-22 LAB — CERVICOVAGINAL ANCILLARY ONLY
Bacterial Vaginitis (gardnerella): POSITIVE — AB
Candida Glabrata: NEGATIVE
Candida Vaginitis: NEGATIVE
Chlamydia: NEGATIVE
Comment: NEGATIVE
Comment: NEGATIVE
Comment: NEGATIVE
Comment: NEGATIVE
Comment: NEGATIVE
Comment: NORMAL
Neisseria Gonorrhea: NEGATIVE
Trichomonas: POSITIVE — AB

## 2023-12-22 LAB — RPR: RPR Ser Ql: NONREACTIVE

## 2023-12-22 LAB — URINE CULTURE: Culture: <10000 — AB

## 2023-12-22 NOTE — Progress Notes (Signed)
 Negative for HIV and syphilis

## 2023-12-23 ENCOUNTER — Telehealth: Payer: Self-pay

## 2023-12-23 MED ORDER — METRONIDAZOLE 500 MG PO TABS: 500.0000 mg | ORAL_TABLET | Freq: Two times a day (BID) | ORAL | 0 refills | Status: AC

## 2023-12-23 NOTE — Telephone Encounter (Signed)
 Per protocol, pt requires tx with metronidazole. Attempted to reach patient x1. LVM.  Rx sent to pharmacy on file.

## 2024-11-11 ENCOUNTER — Emergency Department (HOSPITAL_COMMUNITY): Payer: Self-pay

## 2024-11-11 ENCOUNTER — Other Ambulatory Visit: Payer: Self-pay

## 2024-11-11 ENCOUNTER — Encounter (HOSPITAL_COMMUNITY): Payer: Self-pay

## 2024-11-11 ENCOUNTER — Emergency Department (HOSPITAL_COMMUNITY)
Admission: EM | Admit: 2024-11-11 | Discharge: 2024-11-11 | Disposition: A | Discharge status: Home / Self Care | Payer: Self-pay | Attending: Emergency Medicine | Admitting: Emergency Medicine

## 2024-11-11 DIAGNOSIS — Z3490 Encounter for supervision of normal pregnancy, unspecified, unspecified trimester: Secondary | ICD-10-CM

## 2024-11-11 DIAGNOSIS — Z3491 Encounter for supervision of normal pregnancy, unspecified, first trimester: Secondary | ICD-10-CM | POA: Insufficient documentation

## 2024-11-11 DIAGNOSIS — J45909 Unspecified asthma, uncomplicated: Secondary | ICD-10-CM | POA: Insufficient documentation

## 2024-11-11 LAB — URINALYSIS, ROUTINE W REFLEX MICROSCOPIC
Bilirubin Urine: NEGATIVE
Glucose, UA: NEGATIVE mg/dL
Hgb urine dipstick: NEGATIVE
Ketones, ur: 20 mg/dL — AB
Leukocytes,Ua: NEGATIVE
Nitrite: NEGATIVE
Protein, ur: NEGATIVE mg/dL
Specific Gravity, Urine: 1.023 (ref 1.005–1.030)
pH: 6 (ref 5.0–8.0)

## 2024-11-11 LAB — PREGNANCY, URINE: Preg Test, Ur: POSITIVE — AB

## 2024-11-11 LAB — HCG, QUANTITATIVE, PREGNANCY: hCG, Beta Chain, Quant, S: 77144 m[IU]/mL — ABNORMAL HIGH

## 2024-11-11 NOTE — Discharge Instructions (Addendum)
 Thank you letting us  evaluate you today.  Your ultrasound showed a 6-week fetus in your uterus.  This is the proper placement of the pregnancy.  No ectopic pregnancy.  Please follow-up with GYN.  Provider recommendation if you need 1.  Return to Emergency Department for experience intractable vomiting, severe debilitating abdominal pain especially with vaginal bleeding, worsening symptoms

## 2024-11-11 NOTE — ED Provider Notes (Signed)
 " Sheila Kelly EMERGENCY DEPARTMENT AT [facility] Provider Note   CSN: [id number] Arrival date & time: 11/11/24  1948     Patient presents with: Possible Pregnancy   Sheila Kelly is a 24 y.o. female with past medical history of asthma, ectopic pregnancy 2023 presents Emergency Department for verification of pregnancy after a positive home test.  LMP 12/1-12/6 and normally has a period every month.  Was scheduled to have a period on 12/31 but did not.  Patient endorses mild nausea but no vomiting.  Has a history of ectopic pregnancy in 2023 where she recalls that she had some vaginal spotting but had no pain at all in 2023.  Denies vaginal bleeding, abdominal pain    Possible Pregnancy       Prior to Admission medications  Medication Sig Start Date End Date Taking? Authorizing Provider  acetaminophen  (TYLENOL ) 325 MG tablet Take 2 tablets (650 mg total) by mouth every 6 (six) hours as needed. 02/05/22   Ozan, Jennifer, DO  cephALEXin  (KEFLEX ) 500 MG capsule Take 1 capsule (500 mg total) by mouth 4 (four) times daily. 03/13/23   Dreama, [place]  N, FNP  ibuprofen  (ADVIL ) 600 MG tablet Take 1 tablet (600 mg total) by mouth every 6 (six) hours as needed. 02/05/22   Ozan, Jennifer, DO    Allergies: Patient has no known allergies.    Review of Systems  Updated Vital Signs BP 138/68   Pulse 72   Temp 98.2 F (36.8 C)   Resp 18   Ht 5' 8 (1.727 m)   Wt 51.3 kg   LMP 09/07/2024 (Exact Date)   SpO2 100%   BMI 17.18 kg/m   Physical Exam Vitals and nursing note reviewed.  Constitutional:      General: She is not in acute distress.    Appearance: Normal appearance.  HENT:     Head: Normocephalic and atraumatic.  Eyes:     Conjunctiva/sclera: Conjunctivae normal.  Cardiovascular:     Rate and Rhythm: Normal rate.  Pulmonary:     Effort: Pulmonary effort is normal. No respiratory distress.  Abdominal:     General: Bowel sounds are normal. There is no distension.      Palpations: Abdomen is soft.     Tenderness: There is abdominal tenderness in the right lower quadrant. There is no guarding or rebound.  Skin:    Coloration: Skin is not jaundiced or pale.  Neurological:     Mental Status: She is alert. Mental status is at baseline.     (all labs ordered are listed, but only abnormal results are displayed) Labs Reviewed  PREGNANCY, URINE - Abnormal; Notable for the following components:      Result Value   Preg Test, Ur POSITIVE (*)    All other components within normal limits  HCG, QUANTITATIVE, PREGNANCY - Abnormal; Notable for the following components:   hCG, Beta Chain, Quant, S 77,144 (*)    All other components within normal limits  URINALYSIS, ROUTINE W REFLEX MICROSCOPIC - Abnormal; Notable for the following components:   Ketones, ur 20 (*)    All other components within normal limits    EKG: None  Radiology: US  OB Comp < 14 Wks Result Date: 11/11/2024 EXAM: OBSTETRIC ULTRASOUND FIRST TRIMESTER TECHNIQUE: Transvaginal first trimester obstetric pelvic duplex ultrasound was performed with real-time imaging, color flow Doppler imaging, and spectral analysis. COMPARISON: None available. CLINICAL HISTORY: r/o ectopic FINDINGS: UTERUS: No focal myometrial mass. GESTATIONAL SAC(S): Single normal appearing intrauterine  gestational sac. No subchorionic hemorrhage. YOLK SAC: Present. EMBRYO(<11WK) /FETUS(>=11WK): Fetal pole present. CROWN RUMP LENGTH: CRL measures 3.8 mm, corresponding to 6w 0d by Hadlock. RATE OF CARDIAC ACTIVITY: Fetal heart rate is 124 bpm. RIGHT OVARY: Unremarkable. Normal arterial and venous flow. LEFT OVARY: Unremarkable. Normal arterial and venous flow. FREE FLUID: No free fluid. MEASUREMENTS ESTIMATED GESTATIONAL AGE BY CURRENT ULTRASOUND: 6w 0d (by Hadlock) ESTIMATED DUE DATE: 2025-07-07 (by CRL) IMPRESSION: 1. Single intrauterine gestation with yolk sac and fetal pole, corresponding to 6 weeks 0 days by CRL. 2. Fetal heart rate  measures 124 bpm. Electronically signed by: Pinkie Pebbles MD MD 11/11/2024 10:57 PM EST RP Workstation: [id number]    Medications Ordered in the ED - No data to display                                  Medical Decision Making Amount and/or Complexity of Data Reviewed Labs: ordered. Radiology: ordered.   Patient presents to the ED for concern of pregnancy, this involves an extensive number of treatment options, and is a complaint that carries with it a high risk of complications and morbidity.  The differential diagnosis includes ectopic pregnancy, IUP miscarriage   Co morbidities that complicate the patient evaluation  History of ectopic pregnancy in 2023   Additional history obtained:  Additional history obtained from Nursing   External records from outside source obtained and reviewed including triage note   Lab Tests:  I Ordered, and personally interpreted labs.  The pertinent results include:   hCG quant 77,144 UA without Hgb nor infection   Imaging Studies ordered:  I ordered imaging studies including TVUS  I independently visualized and interpreted imaging which showed  Single intrauterine gestation with yolk sac and fetal pole, corresponding to 6 weeks 0 days by CRL. Fetal heart rate measures 124 bpm I agree with the radiologist interpretation    Problem List / ED Course:  Confirmed IUP on ultrasound Vital signs hemodynamically stable with no fever no tachycardia hCG quant 77,144 UA without Hgb nor signs of infection On exam, patient has mild tenderness in the right lower quadrant of her abdomen.  As patient has a history of ectopic pregnancy in 2023 did not have any time, did proceed with TVUS to ensure no ectopic pregnancy as etiology of this mild tenderness.  She is not currently having any bleeding although she had mild spotting in 2023 TVUS confirms IUP at 6 weeks.  No ectopic pregnancy Patient does not currently have OB/GYN to provided her with  any new and discussed for her to follow-up with GYN for further management   Reevaluation:  After the interventions noted above, I reevaluated the patient and found that they have :stayed the same    Dispostion:  After consideration of the diagnostic results and the patients response to treatment, I feel that the patent would benefit from outpatient regiment with OB/GYN establishment.   Discussed ED workup, disposition, return to ED precautions with patient who expresses understanding agrees with plan.  All questions answered to their satisfaction.  They are agreeable to plan.  Discharge instructions provided on paperwork  Final diagnoses:  Confirmed intrauterine pregnancy on ultrasound    ED Discharge Orders     None        Sheila Kelly, [place] 11/11/24 2323  "

## 2024-11-11 NOTE — ED Triage Notes (Signed)
 Seeking verification of pregnancy after a (+) home test.   LMP 09/07/24  Hx of tubal pregnancy in 2023. Denies any pain, bleeding, etc.
